# Patient Record
Sex: Female | Born: 1975 | Race: White | Hispanic: No | Marital: Married | Smoking: Never smoker
Health system: Southern US, Community
[De-identification: ages and names within clinical notes are randomized; demographics above are authoritative.]

## PROBLEM LIST (undated history)

## (undated) DIAGNOSIS — T7840XA Allergy, unspecified, initial encounter: Secondary | ICD-10-CM

## (undated) DIAGNOSIS — F329 Major depressive disorder, single episode, unspecified: Secondary | ICD-10-CM

## (undated) DIAGNOSIS — F32A Depression, unspecified: Secondary | ICD-10-CM

## (undated) DIAGNOSIS — F419 Anxiety disorder, unspecified: Secondary | ICD-10-CM

## (undated) DIAGNOSIS — E785 Hyperlipidemia, unspecified: Secondary | ICD-10-CM

## (undated) HISTORY — PX: BREAST SURGERY: SHX581

## (undated) HISTORY — DX: Depression, unspecified: F32.A

## (undated) HISTORY — DX: Anxiety disorder, unspecified: F41.9

## (undated) HISTORY — DX: Major depressive disorder, single episode, unspecified: F32.9

## (undated) HISTORY — PX: CHOLECYSTECTOMY: SHX55

## (undated) HISTORY — DX: Hyperlipidemia, unspecified: E78.5

## (undated) HISTORY — DX: Allergy, unspecified, initial encounter: T78.40XA

---

## 2001-04-28 ENCOUNTER — Encounter: Payer: Self-pay | Admitting: Obstetrics and Gynecology

## 2001-04-28 ENCOUNTER — Ambulatory Visit (HOSPITAL_COMMUNITY): Admission: RE | Admit: 2001-04-28 | Discharge: 2001-04-28 | Payer: Self-pay | Admitting: *Deleted

## 2001-12-08 ENCOUNTER — Other Ambulatory Visit: Admission: RE | Admit: 2001-12-08 | Discharge: 2001-12-08 | Payer: Self-pay | Admitting: Obstetrics and Gynecology

## 2002-07-19 ENCOUNTER — Inpatient Hospital Stay (HOSPITAL_COMMUNITY): Admission: RE | Admit: 2002-07-19 | Discharge: 2002-07-21 | Payer: Self-pay | Admitting: Obstetrics and Gynecology

## 2011-10-30 ENCOUNTER — Ambulatory Visit (INDEPENDENT_AMBULATORY_CARE_PROVIDER_SITE_OTHER): Payer: BC Managed Care – PPO

## 2011-10-30 DIAGNOSIS — Z23 Encounter for immunization: Secondary | ICD-10-CM

## 2011-10-30 DIAGNOSIS — L253 Unspecified contact dermatitis due to other chemical products: Secondary | ICD-10-CM

## 2011-10-30 DIAGNOSIS — F341 Dysthymic disorder: Secondary | ICD-10-CM

## 2012-01-15 ENCOUNTER — Ambulatory Visit: Payer: BC Managed Care – PPO | Admitting: Internal Medicine

## 2012-01-30 ENCOUNTER — Other Ambulatory Visit: Payer: Self-pay | Admitting: Internal Medicine

## 2012-01-31 ENCOUNTER — Telehealth: Payer: Self-pay

## 2012-01-31 NOTE — Telephone Encounter (Signed)
Pt would like a refill on celexa 40 mg.

## 2012-02-17 ENCOUNTER — Other Ambulatory Visit: Payer: Self-pay | Admitting: Internal Medicine

## 2012-05-21 ENCOUNTER — Other Ambulatory Visit: Payer: Self-pay | Admitting: Physician Assistant

## 2012-05-22 ENCOUNTER — Other Ambulatory Visit: Payer: Self-pay | Admitting: Internal Medicine

## 2012-06-26 ENCOUNTER — Other Ambulatory Visit: Payer: Self-pay | Admitting: Internal Medicine

## 2012-06-26 ENCOUNTER — Other Ambulatory Visit: Payer: Self-pay | Admitting: Physician Assistant

## 2012-06-26 NOTE — Telephone Encounter (Signed)
Chart pulled DOS 10/30/11

## 2012-06-27 NOTE — Telephone Encounter (Signed)
Please call this patient.  She'd overdue for follow-up and has been notified that she needs an OV for refills x 2.  What's the plan?

## 2012-06-29 NOTE — Telephone Encounter (Signed)
Spoke with patient, and she states she has been unable to come in due to her crazy work schedule.  She thinks she can come in in around 2 weeks, and was transferred to appt center to schedule.  Can we rx x 1 month/

## 2012-07-24 ENCOUNTER — Telehealth: Payer: Self-pay

## 2012-07-24 MED ORDER — CITALOPRAM HYDROBROMIDE 40 MG PO TABS: 40.0000 mg | ORAL_TABLET | Freq: Every day | ORAL | Status: DC

## 2012-07-24 NOTE — Telephone Encounter (Signed)
Please pull paper chart.  

## 2012-07-24 NOTE — Telephone Encounter (Signed)
The patient called to request that her Celexa be refilled.  The patient's pharmacy stated she needed to contact [facility].  Please call the patient back at [phone].

## 2012-07-24 NOTE — Telephone Encounter (Signed)
Please advise patient that she needs an office visit for further refills.

## 2012-07-24 NOTE — Telephone Encounter (Signed)
Chart pulled to PA pool at nurse station DOS 10/30/11

## 2012-07-27 NOTE — Telephone Encounter (Signed)
Notified pt that we did send in a RF but that she is due for OV for add'l RFs. Pt agreed and will come in.

## 2012-08-15 ENCOUNTER — Encounter: Payer: Self-pay | Admitting: Physician Assistant

## 2012-08-15 ENCOUNTER — Ambulatory Visit (INDEPENDENT_AMBULATORY_CARE_PROVIDER_SITE_OTHER): Payer: BC Managed Care – PPO | Admitting: Physician Assistant

## 2012-08-15 VITALS — BP 130/80 | HR 84 | Temp 98.0°F | Resp 18 | Ht 63.0 in | Wt 154.0 lb

## 2012-08-15 DIAGNOSIS — F419 Anxiety disorder, unspecified: Secondary | ICD-10-CM

## 2012-08-15 DIAGNOSIS — F418 Other specified anxiety disorders: Secondary | ICD-10-CM

## 2012-08-15 DIAGNOSIS — F341 Dysthymic disorder: Secondary | ICD-10-CM

## 2012-08-15 DIAGNOSIS — F329 Major depressive disorder, single episode, unspecified: Secondary | ICD-10-CM

## 2012-08-15 DIAGNOSIS — F411 Generalized anxiety disorder: Secondary | ICD-10-CM

## 2012-08-15 MED ORDER — CITALOPRAM HYDROBROMIDE 40 MG PO TABS: 40.0000 mg | ORAL_TABLET | Freq: Every day | ORAL | Status: DC

## 2012-08-15 MED ORDER — CLONAZEPAM 1 MG PO TABS: 1.0000 mg | ORAL_TABLET | Freq: Two times a day (BID) | ORAL | Status: DC | PRN

## 2012-08-15 NOTE — Progress Notes (Signed)
   [address], [place] [address]   Phone [phone]  Subjective:    Patient ID: Diane May, female    DOB: 1976/09/21, 36 y.o.   MRN: [id number]  HPI Pt presents to clinic for recheck of her depression and anxiety.  She has been on celexa since 12/2010 and it has really been helping her.  She has been out for the last 3 days and she feels terrible with increased anxiety and nausea.  She feels like the Ativan is no longer working for her - she feels like she has increased anxiety about 2x/wk when she has increased agitation and irritability. She tried Klonopin right after her mother died and it did not work but she would like to try it again.  She does not want to be on xanax because she has a friend who is addicted and it worries about addictive medications.   Review of Systems  Psychiatric/Behavioral: Negative for disturbed wake/sleep cycle. The patient is nervous/anxious.        Objective:   Physical Exam  Vitals reviewed. Constitutional: She is oriented to person, place, and time. She appears well-developed and well-nourished.  HENT:  Head: Normocephalic and atraumatic.  Right Ear: External ear normal.  Left Ear: External ear normal.  Eyes: Conjunctivae normal are normal.  Pulmonary/Chest: Effort normal.  Neurological: She is alert and oriented to person, place, and time.  Skin: Skin is warm and dry.  Psychiatric: She has a normal mood and affect. Her behavior is normal. Judgment and thought content normal.       Assessment & Plan:   1. Anxiety  clonazePAM (KLONOPIN) 1 MG tablet  2. Depression  clonazePAM (KLONOPIN) 1 MG tablet  3. Depression with anxiety     Also sent in Celexa 40mg  qd #90 1 Rf.  D/w pt that is she continues to have breakthrough anxiety we may want to add Buspar or wellbutrin to her Celexa.  She is not interested in changing agents which I think makes sense because she has had such a good response to Celexa.  Pt will call with response to Klonopin  in after a couple of doses.

## 2012-09-15 ENCOUNTER — Other Ambulatory Visit: Payer: Self-pay | Admitting: Physician Assistant

## 2012-09-15 ENCOUNTER — Other Ambulatory Visit: Payer: Self-pay | Admitting: Radiology

## 2012-09-16 ENCOUNTER — Telehealth: Payer: Self-pay

## 2012-09-16 NOTE — Telephone Encounter (Signed)
Patient states she would like additional medication for anxiety. [facility] [place]

## 2012-09-16 NOTE — Telephone Encounter (Signed)
Also sent in Celexa 40mg  qd #90 1 Rf. D/w pt that is she continues to have breakthrough anxiety we may want to add Buspar or wellbutrin to her Celexa. She is not interested in changing agents which I think makes sense because she has had such a good response to Celexa. Pt will call with response to Klonopin in after a couple of doses.  From office visit with Maralyn Sago on 08/15/12  Left message for her to call me back to advise on what she is requesting, original phone message not clear.

## 2012-09-16 NOTE — Telephone Encounter (Signed)
PT STATES Diane May HAD SPOKEN WITH HER ABOUT ADDING ANOTHER MEDICINE TO THE CELEXA IF SHE WANTED TO TRY AND SHE DOES, BUT SHE DOESN'T WANT THE ONE WITH THE SMOKING. PLEASE CALL [phone]   [facility] [place]

## 2012-09-17 MED ORDER — BUSPIRONE HCL 7.5 MG PO TABS: 7.5000 mg | ORAL_TABLET | Freq: Two times a day (BID) | ORAL | Status: DC

## 2012-09-17 NOTE — Telephone Encounter (Signed)
Will send in Buspar. Please have patient call Maralyn Sago in a week to 10 days and give an update. Sooner if worse.

## 2012-09-17 NOTE — Telephone Encounter (Signed)
Called patient to advise  °

## 2012-09-26 ENCOUNTER — Other Ambulatory Visit: Payer: Self-pay | Admitting: Physician Assistant

## 2012-09-26 NOTE — Telephone Encounter (Signed)
PT IS REQUESTING A REFILL ON HER KLONOPIN [phone]

## 2012-09-28 NOTE — Telephone Encounter (Signed)
At TL desk.  I should see pt next month. To recheck after the addition of buspar.

## 2012-09-29 ENCOUNTER — Other Ambulatory Visit: Payer: Self-pay | Admitting: Radiology

## 2012-10-26 ENCOUNTER — Other Ambulatory Visit: Payer: Self-pay | Admitting: Physician Assistant

## 2012-10-26 NOTE — Telephone Encounter (Signed)
PATIENT CALLING IN REGARDS TO SEEING IF WE RECEIVED A FAX ON HER KLONOPIN RX REFILL FROM HER PHARMACY TODAY. SHE STATES "SHE DOES NOT WANT TO GO CRAZY WITHOUT HER MED'S FOR THE HOLIDAYS". PATIENT SAYS SHE WILL KEEP CALLING. SHE WAS NOTIFIED OF OUR POLICY AND PROCESS. THANK YOU!

## 2012-10-27 ENCOUNTER — Other Ambulatory Visit: Payer: Self-pay | Admitting: *Deleted

## 2012-10-27 NOTE — Telephone Encounter (Signed)
I have sent in the Rx to Tl desk.  I would like her to recheck next month.

## 2012-11-19 ENCOUNTER — Other Ambulatory Visit: Payer: Self-pay | Admitting: Physician Assistant

## 2012-11-19 NOTE — Telephone Encounter (Signed)
Please call the patient - this was supposed to be a prn medication not a daily medication - I filled last on 12/23 so it is early to ask for it - I think that she needs an OV to discuss.

## 2012-11-25 ENCOUNTER — Other Ambulatory Visit: Payer: Self-pay | Admitting: Physician Assistant

## 2012-11-26 ENCOUNTER — Telehealth: Payer: Self-pay

## 2012-11-26 NOTE — Telephone Encounter (Signed)
[facility], they have RX for Clonazepam.

## 2012-11-26 NOTE — Telephone Encounter (Signed)
Patient states the pharmacy has not received her request for clonazePAM (KLONOPIN) 1 MG tablet Please resend the script from the 22nd

## 2013-01-05 ENCOUNTER — Telehealth: Payer: Self-pay

## 2013-01-05 NOTE — Telephone Encounter (Signed)
PATIENT WOULD LIKE SARAH WEBER TO  KNOW SHE NEEDS TO GET REFILLS ON HER KLONOPIN FOR HER NERVES. THE PHARMACIST SAID SHE HAD TO CALL HERE FIRST.  BEST PHONE [phone] (CELL)   PHARMACY CHOICE IS [facility] IN , [place]     [facility]

## 2013-01-06 ENCOUNTER — Other Ambulatory Visit: Payer: Self-pay | Admitting: Physician Assistant

## 2013-01-06 NOTE — Telephone Encounter (Signed)
Pt needs OV 

## 2013-01-07 NOTE — Telephone Encounter (Signed)
Called to advise patient needs office visit.

## 2013-01-11 NOTE — Telephone Encounter (Signed)
I will give patient a few day supply but she will need an OV before any more refills.  At Tl desk for me to sign.

## 2013-02-11 ENCOUNTER — Telehealth: Payer: Self-pay

## 2013-02-11 MED ORDER — CLONAZEPAM 1 MG PO TABS: 0.5000 mg | ORAL_TABLET | Freq: Two times a day (BID) | ORAL | Status: DC | PRN

## 2013-02-11 NOTE — Telephone Encounter (Signed)
Pt has called back wanting to know if her prescription was ready.

## 2013-02-11 NOTE — Telephone Encounter (Signed)
At Tl desk 

## 2013-02-11 NOTE — Telephone Encounter (Signed)
Faxed left message for her to advise.

## 2013-02-11 NOTE — Telephone Encounter (Signed)
Diane May - PT'S NEW INSURANCE HAS NOT KICKED IN YET AND SHE CANT AFFORD TO COME IN AND PAY FULL PRICE RIGHT NOW.  WANTS TO KNOW IF YOU WILL REFILL THE KLONOPIN FOR JUST TWO WEEKS AND THEN SHE WILL BE IN FOR A VISIT. [phone]

## 2013-04-14 ENCOUNTER — Other Ambulatory Visit: Payer: Self-pay | Admitting: Physician Assistant

## 2013-06-16 ENCOUNTER — Other Ambulatory Visit: Payer: Self-pay | Admitting: Physician Assistant

## 2013-07-14 ENCOUNTER — Other Ambulatory Visit: Payer: Self-pay | Admitting: Physician Assistant

## 2013-07-16 ENCOUNTER — Telehealth: Payer: Self-pay

## 2013-07-16 NOTE — Telephone Encounter (Signed)
Pended please advise.  

## 2013-07-16 NOTE — Telephone Encounter (Signed)
Pt states that she is out of celexa ans she is due to have an OV but would like to know if a 7 day supply could be called in for her until she is able to come in . [phone]

## 2013-07-18 MED ORDER — CITALOPRAM HYDROBROMIDE 40 MG PO TABS: 40.0000 mg | ORAL_TABLET | Freq: Every day | ORAL | Status: DC

## 2013-07-18 NOTE — Telephone Encounter (Signed)
Left message rx sent to pharmacy

## 2013-07-18 NOTE — Telephone Encounter (Signed)
Sent!

## 2013-07-20 ENCOUNTER — Other Ambulatory Visit: Payer: Self-pay | Admitting: Physician Assistant

## 2013-07-21 ENCOUNTER — Ambulatory Visit: Payer: BC Managed Care – PPO | Admitting: Emergency Medicine

## 2013-07-21 VITALS — BP 106/76 | HR 80 | Temp 98.8°F | Resp 16 | Ht 63.5 in | Wt 162.2 lb

## 2013-07-21 DIAGNOSIS — F419 Anxiety disorder, unspecified: Secondary | ICD-10-CM

## 2013-07-21 DIAGNOSIS — F411 Generalized anxiety disorder: Secondary | ICD-10-CM

## 2013-07-21 DIAGNOSIS — F329 Major depressive disorder, single episode, unspecified: Secondary | ICD-10-CM

## 2013-07-21 MED ORDER — CITALOPRAM HYDROBROMIDE 40 MG PO TABS: 40.0000 mg | ORAL_TABLET | Freq: Every day | ORAL | Status: DC

## 2013-07-21 MED ORDER — CLONAZEPAM 1 MG PO TABS: 0.5000 mg | ORAL_TABLET | Freq: Two times a day (BID) | ORAL | Status: DC | PRN

## 2013-07-21 MED ORDER — BUSPIRONE HCL 7.5 MG PO TABS: 7.5000 mg | ORAL_TABLET | Freq: Two times a day (BID) | ORAL | Status: DC

## 2013-07-21 NOTE — Progress Notes (Signed)
[facility] [address], [place] [address] [phone]  Date:  07/21/2013   Name:  Diane May   DOB:  Apr 11, 1976   MRN:  [id number]  PCP:  No primary provider on file.    Chief Complaint: Medication Refill   History of Present Illness:  Diane May is a 37 y.o. very pleasant female patient who presents with the following:  Patient here for medication refills.  Denies other complaint or health concern today.  No suicidal thoughts or thoughts of harm to others.     Patient Active Problem List   Diagnosis Date Noted  . Depression with anxiety 08/15/2012    Past Medical History  Diagnosis Date  . Allergy   . Depression     Past Surgical History  Procedure Laterality Date  . Cholecystectomy    . Breast surgery      cyst removal from L breast  . Cesarean section      x3    History  Substance Use Topics  . Smoking status: Never Smoker   . Smokeless tobacco: Not on file  . Alcohol Use: No    Family History  Problem Relation Age of Onset  . Cancer Mother   . Cancer Father     Allergies  Allergen Reactions  . Codeine Nausea Only  . Penicillins Rash    Medication list has been reviewed and updated.  Current Outpatient Prescriptions on File Prior to Visit  Medication Sig Dispense Refill  . busPIRone (BUSPAR) 7.5 MG tablet Take 1 tablet (7.5 mg total) by mouth 2 (two) times daily.  30 tablet  0  . citalopram (CELEXA) 40 MG tablet Take 1 tablet (40 mg total) by mouth daily. Needs office visit (THIRD notice)  7 tablet  0  . clonazePAM (KLONOPIN) 1 MG tablet Take 0.5-1 tablets (0.5-1 mg total) by mouth 2 (two) times daily as needed for anxiety.  15 tablet  0   No current facility-administered medications on file prior to visit.    Review of Systems:  As per HPI, otherwise negative.    Physical Examination: Filed Vitals:   07/21/13 0924  BP: 106/76  Pulse: 80  Temp: 98.8 F (37.1 C)  Resp: 16   Filed Vitals:   07/21/13 0924  Height: 5' 3.5" (1.613 m)  Weight: 162 lb 3.2 oz (73.573 kg)   Body mass index is 28.28 kg/(m^2). Ideal Body Weight: Weight in (lb) to have BMI = 25: 143.1   GEN: WDWN, NAD, Non-toxic, Alert & Oriented x 3 HEENT: Atraumatic, Normocephalic.  Ears and Nose: No external deformity. EXTR: No clubbing/cyanosis/edema NEURO: Normal gait.  PSYCH: Normally interactive. Conversant. Not depressed or anxious appearing.  Calm demeanor.    Assessment and Plan: Refill meds Anxiety Depression Follow up in 6 months   Signed,  Phillips Odor, MD

## 2013-07-21 NOTE — Patient Instructions (Addendum)

## 2013-09-22 NOTE — Telephone Encounter (Signed)
Error

## 2014-01-20 ENCOUNTER — Ambulatory Visit: Payer: BC Managed Care – PPO | Admitting: Family Medicine

## 2014-01-20 VITALS — BP 110/80 | HR 76 | Temp 97.9°F | Resp 16 | Ht 63.0 in | Wt 159.0 lb

## 2014-01-20 DIAGNOSIS — F4323 Adjustment disorder with mixed anxiety and depressed mood: Secondary | ICD-10-CM

## 2014-01-20 MED ORDER — CITALOPRAM HYDROBROMIDE 40 MG PO TABS: 40.0000 mg | ORAL_TABLET | Freq: Every day | ORAL | Status: DC

## 2014-01-20 MED ORDER — CLONAZEPAM 1 MG PO TABS: 0.5000 mg | ORAL_TABLET | Freq: Two times a day (BID) | ORAL | Status: DC | PRN

## 2014-01-20 NOTE — Progress Notes (Signed)
[facility] [address] [place] [address] [phone]  Date:  01/20/2014   Name:  Diane May   DOB:  [birth year]   MRN:  [id number]  PCP:  No primary provider on file.    Chief Complaint: Medication Refill   History of Present Illness:  Diane May is a 38 y.o. very pleasant female patient who presents with the following:  She needs a refill of her medications today.  She uses celexa and klonopin as needed for anxiety.  She is on celexa 40, and is doing ok with this.  She does note that she is tearful sometimes and may lose her temper easily.  No SI or HI.  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    LMP was about one week ago.   Appetite is ok.    She is a non- smoker.    Patient Active Problem List   Diagnosis Date Noted  . Depression with anxiety 08/15/2012    Past Medical History  Diagnosis Date  . Allergy   . Depression     Past Surgical History  Procedure Laterality Date  . Cholecystectomy    . Breast surgery      cyst removal from L breast  . Cesarean section      x3    History  Substance Use Topics  . Smoking status: Never Smoker   . Smokeless tobacco: Not on file  . Alcohol Use: No    Family History  Problem Relation Age of Onset  . Cancer Mother   . Cancer Father     Allergies  Allergen Reactions  . Codeine Nausea Only  . Penicillins Rash    Medication list has been reviewed and updated.  Current Outpatient Prescriptions on File Prior to Visit  Medication Sig Dispense Refill  . citalopram (CELEXA) 40 MG tablet Take 1 tablet (40 mg total) by mouth daily.  40 tablet  5  . clonazePAM (KLONOPIN) 1 MG tablet Take 0.5-1 tablets (0.5-1 mg total) by mouth 2 (two) times daily as needed for anxiety.  60 tablet  5   No current facility-administered medications on file prior to visit.    Review of Systems:  GEN: WDWN, NAD, Non-toxic, A & O x 3 HEENT:  Atraumatic, Normocephalic. Neck supple. No masses, No LAD. Ears and Nose: No external deformity. CV: RRR, No M/G/R. No JVD. No thrill. No extra heart sounds. PULM: CTA B, no wheezes, crackles, rhonchi. No retractions. No resp. distress. No accessory muscle use. ABD: S, NT, ND, +BS. No rebound. No HSM. EXTR: No c/c/e NEURO Normal gait.  PSYCH: Normally interactive. Conversant. Not depressed or anxious appearing.  Calm demeanor.    Physical Examination: Filed Vitals:   01/20/14 1157  BP: 110/80  Pulse: 76  Temp: 97.9 F (36.6 C)  Resp: 16   Filed Vitals:   01/20/14 1157  Height: 5\' 3"  (1.6 m)  Weight: 159 lb (72.122 kg)   Body mass index is 28.17 kg/(m^2). Ideal Body Weight: Weight in (lb) to have BMI = 25: 140.8  GEN: WDWN, NAD, Non-toxic, A & O x 3, looks well HEENT: Atraumatic, Normocephalic. Neck supple. No masses, No LAD. Ears and Nose: No external deformity. CV: RRR, No M/G/R. No JVD. No thrill. No extra heart sounds. PULM: CTA B, no wheezes, crackles, rhonchi. No retractions. No resp. distress. No accessory muscle use. EXTR: No c/c/e NEURO  Normal gait.  PSYCH: Normally interactive. Conversant. Not depressed or anxious appearing.  Calm demeanor.    Assessment and Plan: Adjustment disorder with mixed anxiety and depressed mood - Plan: citalopram (CELEXA) 40 MG tablet, clonazePAM (KLONOPIN) 1 MG tablet  Recheck of depression and anxiety.  Refilled her celexa and klonopin.  She is not sure if her current medication regimen is controlling her sx, but admits she is under a lot of stress right now.  As she is about to start a new business tomorrow elected to stick with her current regimen for another month.  She will keep a symptom diary and let me know how she is doing; at that time consider adding wellbutrin or changing to a different SSRI if warranted. She will let me know sooner if worse.    Signed Lamar Blinks, MD

## 2014-01-20 NOTE — Patient Instructions (Signed)
Please let me know how things are going in a month or so.  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

## 2014-08-09 ENCOUNTER — Other Ambulatory Visit: Payer: Self-pay | Admitting: Family Medicine

## 2014-08-09 DIAGNOSIS — F4323 Adjustment disorder with mixed anxiety and depressed mood: Secondary | ICD-10-CM

## 2014-08-11 MED ORDER — CITALOPRAM HYDROBROMIDE 40 MG PO TABS: 40.0000 mg | ORAL_TABLET | Freq: Every day | ORAL | Status: DC

## 2014-08-11 NOTE — Telephone Encounter (Signed)
Called her as I have not heard from her.  She does not have health insurance for the time being, but plans to come in next month or so. Her husband was laid off so she lost her insurance. She is doing very well overall and would like to continue her medications if possible.  I will refill for another 90 days supply, but she knows she needs to see me by the end of the year and plans to do so.

## 2014-10-19 ENCOUNTER — Telehealth: Payer: Self-pay

## 2014-10-19 NOTE — Telephone Encounter (Signed)
°  Pt states she is having some issues she want to discuss with a nurse or someone. Please call [phone]

## 2014-10-19 NOTE — Telephone Encounter (Signed)
Spoke to pt. She has been having heavy vaginal bleeding with blood clots for the past 2-3 months. She was wondering if I knew what could be causing it. I told her she should contact her gynecologist regarding this issue. She does not have a regular gynecologist, but was going to call [facility] to see what they say; she would like to go somewhere that has [place].  If need be I told her she could be seen here and referred out if need be.

## 2014-12-11 ENCOUNTER — Emergency Department (HOSPITAL_COMMUNITY)
Admission: EM | Admit: 2014-12-11 | Discharge: 2014-12-11 | Disposition: A | Discharge status: Home / Self Care | Payer: 59 | Attending: Emergency Medicine | Admitting: Emergency Medicine

## 2014-12-11 DIAGNOSIS — Z79899 Other long term (current) drug therapy: Secondary | ICD-10-CM | POA: Diagnosis not present

## 2014-12-11 DIAGNOSIS — F329 Major depressive disorder, single episode, unspecified: Secondary | ICD-10-CM | POA: Insufficient documentation

## 2014-12-11 DIAGNOSIS — M62838 Other muscle spasm: Secondary | ICD-10-CM | POA: Diagnosis not present

## 2014-12-11 DIAGNOSIS — M549 Dorsalgia, unspecified: Secondary | ICD-10-CM | POA: Diagnosis present

## 2014-12-11 MED ORDER — ONDANSETRON 4 MG PO TBDP: 4.0000 mg | ORAL_TABLET | Freq: Three times a day (TID) | ORAL | Status: DC | PRN

## 2014-12-11 MED ORDER — ONDANSETRON 4 MG PO TBDP
4.0000 mg | ORAL_TABLET | Freq: Once | ORAL | Status: AC
  Administered 2014-12-11: 4 mg via ORAL
  Filled 2014-12-11: qty 1

## 2014-12-11 MED ORDER — DIAZEPAM 5 MG PO TABS
5.0000 mg | ORAL_TABLET | Freq: Once | ORAL | Status: AC
  Administered 2014-12-11: 5 mg via ORAL
  Filled 2014-12-11: qty 1

## 2014-12-11 MED ORDER — IBUPROFEN 800 MG PO TABS
800.0000 mg | ORAL_TABLET | Freq: Once | ORAL | Status: AC
  Administered 2014-12-11: 800 mg via ORAL
  Filled 2014-12-11: qty 1

## 2014-12-11 MED ORDER — DIAZEPAM 5 MG PO TABS: 5.0000 mg | ORAL_TABLET | Freq: Three times a day (TID) | ORAL | Status: DC | PRN

## 2014-12-11 MED ORDER — OXYCODONE-ACETAMINOPHEN 5-325 MG PO TABS
2.0000 | ORAL_TABLET | Freq: Once | ORAL | Status: AC
  Administered 2014-12-11: 2 via ORAL
  Filled 2014-12-11: qty 2

## 2014-12-11 MED ORDER — OXYCODONE-ACETAMINOPHEN 5-325 MG PO TABS: 1.0000 | ORAL_TABLET | ORAL | Status: DC | PRN

## 2014-12-11 MED ORDER — IBUPROFEN 800 MG PO TABS: 800.0000 mg | ORAL_TABLET | Freq: Three times a day (TID) | ORAL | Status: DC | PRN

## 2014-12-11 NOTE — ED Provider Notes (Signed)
TIME SEEN: 4:20 AM  CHIEF COMPLAINT: Right-sided neck pain  HPI: Pt is a 39 y.o. right-hand-dominant female with history of depression who presents to the emergency department with right-sided trapezius muscle spasm and pain. She's having difficulty turning her neck because of this pain. Pain started yesterday. 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 Pain is worse with movement of her arm and better with massage. Unable to sleep tonight. No numbness, tingling or focal weakness. Normal gait. No bowel or bladder incontinence. No fever. No midline spinal tenderness.  ROS: See HPI Constitutional: no fever  Eyes: no drainage  ENT: no runny nose   Cardiovascular:  no chest pain  Resp: no SOB  GI: no vomiting GU: no dysuria Integumentary: no rash  Allergy: no hives  Musculoskeletal: no leg swelling  Neurological: no slurred speech ROS otherwise negative  PAST MEDICAL HISTORY/PAST SURGICAL HISTORY:  Past Medical History  Diagnosis Date  . Allergy   . Depression     MEDICATIONS:  Prior to Admission medications   Medication Sig Start Date End Date Taking? Authorizing Provider  citalopram (CELEXA) 40 MG tablet Take 1 tablet (40 mg total) by mouth daily. 08/11/14  Yes Gay Filler Copland, MD  clonazePAM (KLONOPIN) 1 MG tablet TAKE 1/2-1 TABLETS BY MOUTH TWICE DAILY AS NEEDED FOR ANXIETY 08/11/14  Yes Darreld Mclean, MD    ALLERGIES:  Allergies  Allergen Reactions  . Codeine Nausea Only  . Penicillins Rash    SOCIAL HISTORY:  History  Substance Use Topics  . Smoking status: Never Smoker   . Smokeless tobacco: Not on file  . Alcohol Use: No    FAMILY HISTORY: Family History  Problem Relation Age of Onset  . Cancer Mother   . Cancer Father     EXAM: BP 116/85 mmHg  Pulse 91  Temp(Src) 97.8 F (36.6 C) (Oral)  Resp 18  Ht 5\' 4"  (1.626 m)  Wt 165 lb (74.844 kg)  BMI 28.31 kg/m2  SpO2 99%  LMP 12/11/2014 (Exact  Date) CONSTITUTIONAL: Alert and oriented and responds appropriately to questions. Well-appearing; well-nourished HEAD: Normocephalic EYES: Conjunctivae clear, PERRL ENT: normal nose; no rhinorrhea; moist mucous membranes; pharynx without lesions noted NECK: Supple, no meningismus, no LAD; no midline spinal toes or step-off or deformity, patient has pain with turning her neck to the right and left, tender to palpation throughout the right trapezius muscle with muscle spasm but no obvious deformity of the shoulder or neck and there are no lesions or ecchymosis or erythema in this area  CARD: RRR; S1 and S2 appreciated; no murmurs, no clicks, no rubs, no gallops RESP: Normal chest excursion without splinting or tachypnea; breath sounds clear and equal bilaterally; no wheezes, no rhonchi, no rales,  ABD/GI: Normal bowel sounds; non-distended; soft, non-tender, no rebound, no guarding BACK:  The back appears normal and is non-tender to palpation, there is no CVA tenderness EXT: Normal ROM in all joints; non-tender to palpation; no edema; normal capillary refill; no cyanosis    SKIN: Normal color for age and race; warm NEURO: Moves all extremities equally, sensation to light touch intact diffusely, normal gait PSYCH: The patient's mood and manner are appropriate. Grooming and personal hygiene are appropriate.  MEDICAL DECISION MAKING: Patient here with trapezius muscle spasm neck strain. Will discharge home on Percocet, Valium, ibuprofen and Zofran. She is neurologically intact. She has no midline spinal tenderness. I do not feel she needs any neck imaging  at this time. Discussed return precautions. She verbalized understanding and is comfortable with plan.      [place], DO 12/11/14 [phone]

## 2014-12-11 NOTE — ED Notes (Signed)
Pt alert & oriented x4, stable gait. Patient given discharge instructions, paperwork & prescription(s). Patient informed not to drive, operate any equipment & handel any important documents 4 hours after taking pain medication. Patient  instructed to stop at the registration desk to finish any additional paperwork. Patient  verbalized understanding. Pt left department w/ no further questions. 

## 2014-12-11 NOTE — Discharge Instructions (Signed)
Muscle Cramps and Spasms Muscle cramps and spasms occur when a muscle or muscles tighten and you have no control over this tightening (involuntary muscle contraction). They are a common problem and can develop in any muscle. The most common place is in the calf muscles of the leg. Both muscle cramps and muscle spasms are involuntary muscle contractions, but they also have differences:   Muscle cramps are sporadic and painful. They may last a few seconds to a quarter of an hour. Muscle cramps are often more forceful and last longer than muscle spasms.  Muscle spasms may or may not be painful. They may also last just a few seconds or much longer. CAUSES  It is uncommon for cramps or spasms to be due to a serious underlying problem. In many cases, the cause of cramps or spasms is unknown. Some common causes are:   Overexertion.   Overuse from repetitive motions (doing the same thing over and over).   Remaining in a certain position for a long period of time.   Improper preparation, form, or technique while performing a sport or activity.   Dehydration.   Injury.   Side effects of some medicines.   Abnormally low levels of the salts and ions in your blood (electrolytes), especially potassium and calcium. This could happen if you are taking water pills (diuretics) or you are pregnant.  Some underlying medical problems can make it more likely to develop cramps or spasms. These include, but are not limited to:   Diabetes.   Parkinson disease.   Hormone disorders, such as thyroid problems.   Alcohol abuse.   Diseases specific to muscles, joints, and bones.   Blood vessel disease where not enough blood is getting to the muscles.  HOME CARE INSTRUCTIONS   Stay well hydrated. Drink enough water and fluids to keep your urine clear or pale yellow.  It may be helpful to massage, stretch, and relax the affected muscle.  For tight or tense muscles, use a warm towel, heating  pad, or hot shower water directed to the affected area.  If you are sore or have pain after a cramp or spasm, applying ice to the affected area may relieve discomfort.  Put ice in a plastic bag.  Place a towel between your skin and the bag.  Leave the ice on for 15-20 minutes, 03-04 times a day.  Medicines used to treat a known cause of cramps or spasms may help reduce their frequency or severity. Only take over-the-counter or prescription medicines as directed by your caregiver. SEEK MEDICAL CARE IF:  Your cramps or spasms get more severe, more frequent, or do not improve over time.  MAKE SURE YOU:   Understand these instructions.  Will watch your condition.  Will get help right away if you are not doing well or get worse. Document Released: 04/12/2002 Document Revised: 02/15/2013 Document Reviewed: 10/07/2012 [facility] Patient Information 2015 [place], [place]. This information is not intended to replace advice given to you by your health care provider. Make sure you discuss any questions you have with your health care provider.  Heat Therapy Heat therapy can help ease sore, stiff, injured, and tight muscles and joints. Heat relaxes your muscles, which may help ease your pain.  RISKS AND COMPLICATIONS If you have any of the following conditions, do not use heat therapy unless your health care provider has approved:  Poor circulation.  Healing wounds or scarred skin in the area being treated.  Diabetes, heart disease, or high  blood pressure.  Not being able to feel (numbness) the area being treated.  Unusual swelling of the area being treated.  Active infections.  Blood clots.  Cancer.  Inability to communicate pain. This may include young children and people who have problems with their brain function (dementia).  Pregnancy. Heat therapy should only be used on old, pre-existing, or long-lasting (chronic) injuries. Do not use heat therapy on new injuries unless directed  by your health care provider. HOW TO USE HEAT THERAPY There are several different kinds of heat therapy, including:  Moist heat pack.  Warm water bath.  Hot water bottle.  Electric heating pad.  Heated gel pack.  Heated wrap.  Electric heating pad. Use the heat therapy method suggested by your health care provider. Follow your health care provider's instructions on when and how to use heat therapy. GENERAL HEAT THERAPY RECOMMENDATIONS  Do not sleep while using heat therapy. Only use heat therapy while you are awake.  Your skin may turn pink while using heat therapy. Do not use heat therapy if your skin turns red.  Do not use heat therapy if you have new pain.  High heat or long exposure to heat can cause burns. Be careful when using heat therapy to avoid burning your skin.  Do not use heat therapy on areas of your skin that are already irritated, such as with a rash or sunburn. SEEK MEDICAL CARE IF:  You have blisters, redness, swelling, or numbness.  You have new pain.  Your pain is worse. MAKE SURE YOU:  Understand these instructions.  Will watch your condition.  Will get help right away if you are not doing well or get worse. Document Released: 01/13/2012 Document Revised: 03/07/2014 Document Reviewed: 12/14/2013 [facility] Patient Information 2015 [place], [place]. This information is not intended to replace advice given to you by your health care provider. Make sure you discuss any questions you have with your health care provider.   Cervical Sprain/Strain/Spasm A cervical sprain is an injury in the neck in which the strong, fibrous tissues (ligaments) that connect your neck bones stretch or tear. Cervical sprains can range from mild to severe. Severe cervical sprains can cause the neck vertebrae to be unstable. This can lead to damage of the spinal cord and can result in serious nervous system problems. The amount of time it takes for a cervical sprain to get better  depends on the cause and extent of the injury. Most cervical sprains heal in 1 to 3 weeks. CAUSES  Severe cervical sprains may be caused by:   Contact sport injuries (such as from football, rugby, wrestling, hockey, auto racing, gymnastics, diving, martial arts, or boxing).   Motor vehicle collisions.   Whiplash injuries. This is an injury from a sudden forward and backward whipping movement of the head and neck.  Falls.  Mild cervical sprains may be caused by:   Being in an awkward position, such as while cradling a telephone between your ear and shoulder.   Sitting in a chair that does not offer proper support.   Working at a poorly Landscape [employer] station.   Looking up or down for long periods of time.  SYMPTOMS   Pain, soreness, stiffness, or a burning sensation in the front, back, or sides of the neck. This discomfort may develop immediately after the injury or slowly, 24 hours or more after the injury.   Pain or tenderness directly in the middle of the back of the neck.   Shoulder  or upper back pain.   Limited ability to move the neck.   Headache.   Dizziness.   Weakness, numbness, or tingling in the hands or arms.   Muscle spasms.   Difficulty swallowing or chewing.   Tenderness and swelling of the neck.  DIAGNOSIS  Most of the time your health care provider can diagnose a cervical sprain by taking your history and doing a physical exam. Your health care provider will ask about previous neck injuries and any known neck problems, such as arthritis in the neck. X-rays may be taken to find out if there are any other problems, such as with the bones of the neck. Other tests, such as a CT scan or MRI, may also be needed.  TREATMENT  Treatment depends on the severity of the cervical sprain. Mild sprains can be treated with rest, keeping the neck in place (immobilization), and pain medicines. Severe cervical sprains are immediately immobilized. Further  treatment is done to help with pain, muscle spasms, and other symptoms and may include:  Medicines, such as pain relievers, numbing medicines, or muscle relaxants.   Physical therapy. This may involve stretching exercises, strengthening exercises, and posture training. Exercises and improved posture can help stabilize the neck, strengthen muscles, and help stop symptoms from returning.  HOME CARE INSTRUCTIONS   Put ice on the injured area.   Put ice in a plastic bag.   Place a towel between your skin and the bag.   Leave the ice on for 15-20 minutes, 3-4 times a day.   If your injury was severe, you may have been given a cervical collar to wear. A cervical collar is a two-piece collar designed to keep your neck from moving while it heals.  Do not remove the collar unless instructed by your health care provider.  If you have long hair, keep it outside of the collar.  Ask your health care provider before making any adjustments to your collar. Minor adjustments may be required over time to improve comfort and reduce pressure on your chin or on the back of your head.  Ifyou are allowed to remove the collar for cleaning or bathing, follow your health care provider's instructions on how to do so safely.  Keep your collar clean by wiping it with mild soap and water and drying it completely. If the collar you have been given includes removable pads, remove them every 1-2 days and hand wash them with soap and water. Allow them to air dry. They should be completely dry before you wear them in the collar.  If you are allowed to remove the collar for cleaning and bathing, wash and dry the skin of your neck. Check your skin for irritation or sores. If you see any, tell your health care provider.  Do not drive while wearing the collar.   Only take over-the-counter or prescription medicines for pain, discomfort, or fever as directed by your health care provider.   Keep all follow-up  appointments as directed by your health care provider.   Keep all physical therapy appointments as directed by your health care provider.   Make any needed adjustments to your workstation to promote good posture.   Avoid positions and activities that make your symptoms worse.   Warm up and stretch before being active to help prevent problems.  SEEK MEDICAL CARE IF:   Your pain is not controlled with medicine.   You are unable to decrease your pain medicine over time as planned.  Your activity level is not improving as expected.  SEEK IMMEDIATE MEDICAL CARE IF:   You develop any bleeding.  You develop stomach upset.  You have signs of an allergic reaction to your medicine.   Your symptoms get worse.   You develop new, unexplained symptoms.   You have numbness, tingling, weakness, or paralysis in any part of your body.  MAKE SURE YOU:   Understand these instructions.  Will watch your condition.  Will get help right away if you are not doing well or get worse. Document Released: 08/18/2007 Document Revised: 10/26/2013 Document Reviewed: 04/28/2013 [facility] Patient Information 2015 [place], [place]. This information is not intended to replace advice given to you by your health care provider. Make sure you discuss any questions you have with your health care provider.

## 2014-12-25 ENCOUNTER — Emergency Department (HOSPITAL_COMMUNITY)
Admission: EM | Admit: 2014-12-25 | Discharge: 2014-12-25 | Disposition: A | Discharge status: Home / Self Care | Payer: 59 | Attending: Emergency Medicine | Admitting: Emergency Medicine

## 2014-12-25 ENCOUNTER — Encounter (HOSPITAL_COMMUNITY): Payer: Self-pay | Admitting: Emergency Medicine

## 2014-12-25 ENCOUNTER — Ambulatory Visit: Payer: 59

## 2014-12-25 DIAGNOSIS — M542 Cervicalgia: Secondary | ICD-10-CM | POA: Diagnosis not present

## 2014-12-25 DIAGNOSIS — R200 Anesthesia of skin: Secondary | ICD-10-CM | POA: Diagnosis not present

## 2014-12-25 DIAGNOSIS — Z880 Allergy status to penicillin: Secondary | ICD-10-CM | POA: Diagnosis not present

## 2014-12-25 DIAGNOSIS — M25511 Pain in right shoulder: Secondary | ICD-10-CM | POA: Insufficient documentation

## 2014-12-25 DIAGNOSIS — Z79899 Other long term (current) drug therapy: Secondary | ICD-10-CM | POA: Insufficient documentation

## 2014-12-25 DIAGNOSIS — F329 Major depressive disorder, single episode, unspecified: Secondary | ICD-10-CM | POA: Diagnosis not present

## 2014-12-25 MED ORDER — BACLOFEN 20 MG PO TABS: 20.0000 mg | ORAL_TABLET | Freq: Three times a day (TID) | ORAL | Status: DC

## 2014-12-25 MED ORDER — TRAMADOL HCL 50 MG PO TABS: 50.0000 mg | ORAL_TABLET | Freq: Four times a day (QID) | ORAL | Status: DC | PRN

## 2014-12-25 MED ORDER — PREDNISONE 20 MG PO TABS: ORAL_TABLET | ORAL | Status: DC

## 2014-12-25 NOTE — ED Notes (Signed)
Dr Waverly Ferrari in prior to RN, see edp assessment for further,

## 2014-12-25 NOTE — ED Provider Notes (Signed)
CSN: [id number]     Arrival date & time 12/25/14  1509 History   First MD Initiated Contact with Patient 12/25/14 1520     Chief Complaint  Patient presents with  . Shoulder Pain     (Consider location/radiation/quality/duration/timing/severity/associated sxs/prior Treatment) HPI Comments: Patient presents to the ER for evaluation of continued pain in the right arm and neck area. Patient reports injury a week ago. She was seen in the ER prescribed medications, but has not had any improvement. Patient reports pain starting in the right side of her neck and going down across the shoulder. She has now started to have increased pain in the area of the right elbow as well. Some numbness and tingling in the thumb and first finger, no weakness in the extremity.  Patient is a 39 y.o. female presenting with shoulder pain.  Shoulder Pain Associated symptoms: neck pain     Past Medical History  Diagnosis Date  . Allergy   . Depression    Past Surgical History  Procedure Laterality Date  . Cholecystectomy    . Breast surgery      cyst removal from L breast  . Cesarean section      x3   Family History  Problem Relation Age of Onset  . Cancer Mother   . Cancer Father    History  Substance Use Topics  . Smoking status: Never Smoker   . Smokeless tobacco: Not on file  . Alcohol Use: No   OB History    No data available     Review of Systems  Musculoskeletal: Positive for arthralgias and neck pain.  Neurological: Positive for numbness. Negative for weakness.  All other systems reviewed and are negative.     Allergies  Codeine and Penicillins  Home Medications   Prior to Admission medications   Medication Sig Start Date End Date Taking? Authorizing Provider  citalopram (CELEXA) 40 MG tablet Take 1 tablet (40 mg total) by mouth daily. 08/11/14   Gay Filler Copland, MD  clonazePAM (KLONOPIN) 1 MG tablet TAKE 1/2-1 TABLETS BY MOUTH TWICE DAILY AS NEEDED FOR ANXIETY 08/11/14    Gay Filler Copland, MD  diazepam (VALIUM) 5 MG tablet Take 1 tablet (5 mg total) by mouth every 8 (eight) hours as needed for muscle spasms. 12/11/14   Kristen N Ward, DO  ibuprofen (ADVIL,MOTRIN) 800 MG tablet Take 1 tablet (800 mg total) by mouth every 8 (eight) hours as needed for mild pain. 12/11/14   Kristen N Ward, DO  ondansetron (ZOFRAN ODT) 4 MG disintegrating tablet Take 1 tablet (4 mg total) by mouth every 8 (eight) hours as needed for nausea or vomiting. 12/11/14   Kristen N Ward, DO  oxyCODONE-acetaminophen (PERCOCET/ROXICET) 5-325 MG per tablet Take 1 tablet by mouth every 4 (four) hours as needed. 12/11/14   Kristen N Ward, DO   BP 129/72 mmHg  Pulse 78  Temp(Src) 98.2 F (36.8 C) (Oral)  Resp 15  Ht 5\' 4"  (1.626 m)  Wt 167 lb (75.751 kg)  BMI 28.65 kg/m2  SpO2 100%  LMP 12/11/2014 (Exact Date) Physical Exam  Constitutional: She is oriented to person, place, and time. She appears well-developed and well-nourished. No distress.  HENT:  Head: Normocephalic and atraumatic.  Right Ear: Hearing normal.  Left Ear: Hearing normal.  Nose: Nose normal.  Mouth/Throat: Oropharynx is clear and moist and mucous membranes are normal.  Eyes: Conjunctivae and EOM are normal. Pupils are equal, round, and reactive to light.  Neck:  Normal range of motion. Neck supple. Muscular tenderness present.    Cardiovascular: Regular rhythm, S1 normal and S2 normal.  Exam reveals no gallop and no friction rub.   No murmur heard. Pulses:      Radial pulses are 2+ on the right side.  Pulmonary/Chest: Effort normal and breath sounds normal. No respiratory distress. She exhibits no tenderness.  Abdominal: Soft. Normal appearance and bowel sounds are normal. There is no hepatosplenomegaly. There is no tenderness. There is no rebound, no guarding, no tenderness at McBurney's point and negative Murphy's sign. No hernia.  Musculoskeletal:       Right shoulder: She exhibits decreased range of motion and  tenderness. She exhibits no deformity.       Right elbow: Tenderness found. Lateral epicondyle tenderness noted.       Cervical back: She exhibits tenderness.       Back:  Neurological: She is alert and oriented to person, place, and time. She has normal strength. No cranial nerve deficit or sensory deficit. Coordination normal. GCS eye subscore is 4. GCS verbal subscore is 5. GCS motor subscore is 6.  Skin: Skin is warm, dry and intact. No rash noted. No cyanosis.  Psychiatric: She has a normal mood and affect. Her speech is normal and behavior is normal. Thought content normal.  Nursing note and vitals reviewed.   ED Course  Procedures (including critical care time) Labs Review Labs Reviewed - No data to display  Imaging Review No results found.   EKG Interpretation None      MDM   Final diagnoses:  None   right shoulder pain  Patient presents to the ER for evaluation of continued pain in the right shoulder. Patient has pain starting at the right neck and radiating down the arm. She does not have any neurologic deficits on examination. Patient has diffuse tenderness over the trapezius area, right paraspinal neck area. Additionally she has tenderness over the lateral epicondyle of the elbow which worsens with range of motion against resistance. This might be consistent with tendinitis of the elbow, but the pain more proximal areas of questionable origin. Cannot rule out shoulder, such as rotator cuff, but also cervical radiculopathy is possible. Patient will continue treatment with analgesia, was also prescribed a muscle relaxer and prednisone. She is going to follow-up with Dr. Aline Brochure this week.    Orpah Greek, MD 12/25/14 1540

## 2014-12-25 NOTE — Discharge Instructions (Signed)
Shoulder Pain The shoulder is the joint that connects your arms to your body. The bones that form the shoulder joint include the upper arm bone (humerus), the shoulder blade (scapula), and the collarbone (clavicle). The top of the humerus is shaped like a ball and fits into a rather flat socket on the scapula (glenoid cavity). A combination of muscles and strong, fibrous tissues that connect muscles to bones (tendons) support your shoulder joint and hold the ball in the socket. Small, fluid-filled sacs (bursae) are located in different areas of the joint. They act as cushions between the bones and the overlying soft tissues and help reduce friction between the gliding tendons and the bone as you move your arm. Your shoulder joint allows a wide range of motion in your arm. This range of motion allows you to do things like scratch your back or throw a ball. However, this range of motion also makes your shoulder more prone to pain from overuse and injury. Causes of shoulder pain can originate from both injury and overuse and usually can be grouped in the following four categories:  Redness, swelling, and pain (inflammation) of the tendon (tendinitis) or the bursae (bursitis).  Instability, such as a dislocation of the joint.  Inflammation of the joint (arthritis).  Broken bone (fracture). HOME CARE INSTRUCTIONS   Apply ice to the sore area.  Put ice in a plastic bag.  Place a towel between your skin and the bag.  Leave the ice on for 15-20 minutes, 3-4 times per day for the first 2 days, or as directed by your health care provider.  Stop using cold packs if they do not help with the pain.  If you have a shoulder sling or immobilizer, wear it as long as your caregiver instructs. Only remove it to shower or bathe. Move your arm as little as possible, but keep your hand moving to prevent swelling.  Squeeze a soft ball or foam pad as much as possible to help prevent swelling.  Only take  over-the-counter or prescription medicines for pain, discomfort, or fever as directed by your caregiver. SEEK MEDICAL CARE IF:   Your shoulder pain increases, or new pain develops in your arm, hand, or fingers.  Your hand or fingers become cold and numb.  Your pain is not relieved with medicines. SEEK IMMEDIATE MEDICAL CARE IF:   Your arm, hand, or fingers are numb or tingling.  Your arm, hand, or fingers are significantly swollen or turn white or blue. MAKE SURE YOU:   Understand these instructions.  Will watch your condition.  Will get help right away if you are not doing well or get worse. Document Released: 07/31/2005 Document Revised: 03/07/2014 Document Reviewed: 10/05/2011 [facility] Patient Information 2015 [place], [place]. This information is not intended to replace advice given to you by your health care provider. Make sure you discuss any questions you have with your health care provider. Cervical Radiculopathy Cervical radiculopathy happens when a nerve in the neck is pinched or bruised by a slipped (herniated) disk or by arthritic changes in the bones of the cervical spine. This can occur due to an injury or as part of the normal aging process. Pressure on the cervical nerves can cause pain or numbness that runs from your neck all the way down into your arm and fingers. CAUSES  There are many possible causes, including:  Injury.  Muscle tightness in the neck from overuse.  Swollen, painful joints (arthritis).  Breakdown or degeneration in the bones  and joints of the spine (spondylosis) due to aging.  Bone spurs that may develop near the cervical nerves. SYMPTOMS  Symptoms include pain, weakness, or numbness in the affected arm and hand. Pain can be severe or irritating. Symptoms may be worse when extending or turning the neck. DIAGNOSIS  Your caregiver will ask about your symptoms and do a physical exam. He or she may test your strength and reflexes. X-rays, CT  scans, and MRI scans may be needed in cases of injury or if the symptoms do not go away after a period of time. Electromyography (EMG) or nerve conduction testing may be done to study how your nerves and muscles are working. TREATMENT  Your caregiver may recommend certain exercises to help relieve your symptoms. Cervical radiculopathy can, and often does, get better with time and treatment. If your problems continue, treatment options may include:  Wearing a soft collar for short periods of time.  Physical therapy to strengthen the neck muscles.  Medicines, such as nonsteroidal anti-inflammatory drugs (NSAIDs), oral corticosteroids, or spinal injections.  Surgery. Different types of surgery may be done depending on the cause of your problems. HOME CARE INSTRUCTIONS   Put ice on the affected area.  Put ice in a plastic bag.  Place a towel between your skin and the bag.  Leave the ice on for 15-20 minutes, 03-04 times a day or as directed by your caregiver.  If ice does not help, you can try using heat. Take a warm shower or bath, or use a hot water bottle as directed by your caregiver.  You may try a gentle neck and shoulder massage.  Use a flat pillow when you sleep.  Only take over-the-counter or prescription medicines for pain, discomfort, or fever as directed by your caregiver.  If physical therapy was prescribed, follow your caregiver's directions.  If a soft collar was prescribed, use it as directed. SEEK IMMEDIATE MEDICAL CARE IF:   Your pain gets much worse and cannot be controlled with medicines.  You have weakness or numbness in your hand, arm, face, or leg.  You have a high fever or a stiff, rigid neck.  You lose bowel or bladder control (incontinence).  You have trouble with walking, balance, or speaking. MAKE SURE YOU:   Understand these instructions.  Will watch your condition.  Will get help right away if you are not doing well or get worse. Document  Released: 07/16/2001 Document Revised: 01/13/2012 Document Reviewed: 06/04/2011 [facility] Patient Information 2015 [place], [place]. This information is not intended to replace advice given to you by your health care provider. Make sure you discuss any questions you have with your health care provider.

## 2014-12-25 NOTE — ED Notes (Signed)
Injury to right shoulder last week. Was treated with percocet and zofran  Still having pain to right shoulder, rates 8.  Have not taken any medication today.

## 2014-12-26 ENCOUNTER — Ambulatory Visit (INDEPENDENT_AMBULATORY_CARE_PROVIDER_SITE_OTHER): Payer: 59

## 2014-12-26 ENCOUNTER — Ambulatory Visit (INDEPENDENT_AMBULATORY_CARE_PROVIDER_SITE_OTHER): Payer: 59 | Admitting: Internal Medicine

## 2014-12-26 VITALS — BP 114/74 | HR 60 | Temp 97.8°F | Resp 20 | Ht 64.0 in | Wt 164.0 lb

## 2014-12-26 DIAGNOSIS — R112 Nausea with vomiting, unspecified: Secondary | ICD-10-CM

## 2014-12-26 DIAGNOSIS — F4323 Adjustment disorder with mixed anxiety and depressed mood: Secondary | ICD-10-CM

## 2014-12-26 DIAGNOSIS — M25511 Pain in right shoulder: Secondary | ICD-10-CM

## 2014-12-26 MED ORDER — OXYCODONE-ACETAMINOPHEN 5-325 MG PO TABS: 1.0000 | ORAL_TABLET | ORAL | Status: DC | PRN

## 2014-12-26 MED ORDER — KETOROLAC TROMETHAMINE 60 MG/2ML IM SOLN
60.0000 mg | Freq: Once | INTRAMUSCULAR | Status: AC
  Administered 2014-12-26: 60 mg via INTRAMUSCULAR

## 2014-12-26 MED ORDER — ONDANSETRON 4 MG PO TBDP: 4.0000 mg | ORAL_TABLET | Freq: Three times a day (TID) | ORAL | Status: DC | PRN

## 2014-12-26 MED ORDER — CLONAZEPAM 1 MG PO TABS: ORAL_TABLET | ORAL | Status: DC

## 2014-12-26 NOTE — Patient Instructions (Signed)
Take your meds as directed. Stop the tramadol and take the percocet instead for pain. Use the zofran if needed for nausea when you are taking the percocet. Do not drive or use any dangerous machinery when you are taking the percocet. See the sports medicine dr in follow up as directed. If your symptoms worsen return to the office.

## 2014-12-26 NOTE — Progress Notes (Signed)
   Subjective:    Patient ID: Diane May, female    DOB: 24-Jul-1976, 39 y.o.   MRN: [id number]  HPI 39 year old female with cc  1. 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 She has had pain with tingling in the fingers of the right hand fingers 543 . Pain is 5/10 worse with motion of the right shoulder radiates to the hand and the elbow. She has seen 3 drs thus far and has been on norco,steroids, percocet, baclofan and is now on tramadol which is offering her no relief. She has an appointment with  A sports medicine dr in 3 days but is having pain in the should er which is preve1.nting her from work. 2. Depression anxiety She feels more anxious and tearful and is adking for a med change. She was on steroids for her shoulder and has run out of hte klonipin and has not been taking the celexa for fear of side effects while taking the tramadol  She is not suicidal or homicidal and hse is here with her husband and daughter and has a supportive family . She does not want to see a therapist at this time.   Review of Systems  Constitutional: Positive for activity change.  Eyes: Negative.   Respiratory: Negative.   Cardiovascular: Negative.   Gastrointestinal: Negative.   Endocrine: Negative.   Genitourinary: Negative.   Musculoskeletal:       Right shoulder pain Increased with range of motion  Allergic/Immunologic: Negative.   Neurological: Negative.   Hematological: Negative.   Psychiatric/Behavioral: Negative.   All other systems reviewed and are negative.      Objective:   Physical Exam  Constitutional: She is oriented to person, place, and time. She appears well-developed and well-nourished.  HENT:  Head: Normocephalic and atraumatic.  Nose: Nose normal.  Mouth/Throat: Oropharynx is clear and moist.  Eyes: Conjunctivae and EOM are normal. Pupils are equal, round, and reactive to light.  Neck:  Normal range of motion. Neck supple.  Cardiovascular: Normal rate, regular rhythm and normal heart sounds.   Pulmonary/Chest: Effort normal and breath sounds normal.  Abdominal: Soft. Bowel sounds are normal.  Musculoskeletal:  Pain on range of motion of the right shoulder.pain on palpation of the trapezious muscle . Neurovascular is intact.  Neurological: She is alert and oriented to person, place, and time. She has normal reflexes.  Skin: Skin is warm and dry.  Psychiatric: She has a normal mood and affect. Her behavior is normal. Judgment and thought content normal.  Nursing note and vitals reviewed.         Assessment & Plan:  1. Pain in the right shoulder and trapezious muscle   Will stop the tramadol and restart the percocet.  Will administer toradol in the office Agree with referral to ortho/sports medicine Pt may require mri of cervical spine if symptoms continue 2.Depression anxiety/ Medication renewal  Pt has not been taking her celexa or klonipin while tahking these pain meds. Has had more symptoms of anxiety and tearfullness but no suicidal or homocidal thoughts.  I have encouraged her to take her meds, she is going to stop the tamadol and can take the celexa and klonipin with the percocet. Will reeval her depression and anxiety after she completes meds fo rthe shoulder injury.I explained that the prednisone also may have exaccerbated her symptoms.

## 2014-12-27 ENCOUNTER — Telehealth: Payer: Self-pay

## 2014-12-27 DIAGNOSIS — M25511 Pain in right shoulder: Secondary | ICD-10-CM

## 2014-12-27 NOTE — Telephone Encounter (Signed)
Patient was seen yesterday at our office by Dr. Benjaman Lobe and was told she would be referred to [facility] phone # [phone] fax # [phone]. Patient requesting a referral to be put in. Patients call back number is [phone]

## 2014-12-27 NOTE — Progress Notes (Signed)
[facility] reading (PRIMARY) by  Dr. Brigitte Pulse and Benjaman Lobe Rt shoulder xray: no acute abnormality

## 2014-12-27 NOTE — Telephone Encounter (Signed)
Assessment & Plan:  1. Pain in the right shoulder and trapezious muscle  Will stop the tramadol and restart the percocet.  Will administer toradol in the office Agree with referral to ortho/sports medicine Pt may require mri of cervical spine if symptoms continue 2.Depression anxiety/ Medication renewal  Pt has not been taking her celexa or klonipin while tahking these pain meds. Has had more symptoms of anxiety and tearfullness but no suicidal or homocidal thoughts.  I have encouraged her to take her meds, she is going to stop the tamadol and can take the celexa and klonipin with the percocet. Will reeval her depression and anxiety after she completes meds fo rthe shoulder injury.I explained that the prednisone also may have exaccerbated her symptoms.           Dr. Charlotte Sanes was ok with referral to ortho. Referral placed. Called pt to let her know.

## 2014-12-29 ENCOUNTER — Ambulatory Visit (INDEPENDENT_AMBULATORY_CARE_PROVIDER_SITE_OTHER): Payer: 59 | Admitting: Orthopedic Surgery

## 2014-12-29 ENCOUNTER — Ambulatory Visit (INDEPENDENT_AMBULATORY_CARE_PROVIDER_SITE_OTHER): Payer: 59

## 2014-12-29 VITALS — BP 111/73 | Ht 64.0 in | Wt 164.0 lb

## 2014-12-29 DIAGNOSIS — M542 Cervicalgia: Secondary | ICD-10-CM

## 2014-12-29 DIAGNOSIS — M5020 Other cervical disc displacement, unspecified cervical region: Secondary | ICD-10-CM

## 2014-12-29 NOTE — Patient Instructions (Signed)
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  Wean yourself from opioid narcotic medication (Percocet)  Call patient after MRI results for further treatment recommendations.

## 2014-12-29 NOTE — Progress Notes (Signed)
Patient ID: Diane May, female   DOB: 1976/08/06, 39 y.o.   MRN: [id number]  Chief Complaint  Patient presents with  . Follow-up    er follow up neck/right shoulder pain, injured picking up dog 12/11/14    HPI Diane May is a 39 y.o. female.  Presents with H Maddock onset of pain in her cervical spine which started about 2 weeks ago. She's been to the emergency room on 2 occasions. 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   HPI   Past Medical History  Diagnosis Date  . Allergy   . Depression     Past Surgical History  Procedure Laterality Date  . Cholecystectomy    . Breast surgery      cyst removal from L breast  . Cesarean section      x3    Family History  Problem Relation Age of Onset  . Cancer Mother   . Cancer Father     Social History History  Substance Use Topics  . Smoking status: Never Smoker   . Smokeless tobacco: Not on file  . Alcohol Use: No    Allergies  Allergen Reactions  . Codeine Nausea Only  . Penicillins Rash    Current Outpatient Prescriptions  Medication Sig Dispense Refill  . baclofen (LIORESAL) 20 MG tablet Take 1 tablet (20 mg total) by mouth 3 (three) times daily. 30 each 0  . ondansetron (ZOFRAN ODT) 4 MG disintegrating tablet Take 1 tablet (4 mg total) by mouth every 8 (eight) hours as needed for nausea or vomiting. 20 tablet 0  . oxyCODONE-acetaminophen (ROXICET) 5-325 MG per tablet Take 1 tablet by mouth every 4 (four) hours as needed for severe pain. 20 tablet 0  . predniSONE (DELTASONE) 20 MG tablet 3 tabs po daily x 3 days, then 2 tabs x 3 days, then 1.5 tabs x 3 days, then 1 tab x 3 days, then 0.5 tabs  x 3 days 27 tablet 0  . citalopram (CELEXA) 40 MG tablet Take 1 tablet (40 mg total) by mouth daily. (Patient not taking: Reported on 12/29/2014) 90 tablet 0  . clonazePAM (KLONOPIN) 1 MG tablet TAKE 1/2-1 TABLETS BY MOUTH TWICE DAILY AS NEEDED FOR ANXIETY (Patient not taking: Reported on 12/29/2014) 180 tablet 0  . ibuprofen (ADVIL,MOTRIN) 800 MG tablet Take 1 tablet (800 mg total) by mouth every 8 (eight) hours as needed for mild pain. (Patient not taking: Reported on 12/29/2014) 30 tablet 0   No current facility-administered medications for this visit.    Review of Systems Review of Systems Constitutional symptoms none other musculoskeletal symptoms none neurologic symptoms as stated, gait and station remain normal. No urinary symptoms or GI symptoms other than nausea from medication Blood pressure 111/73, height 5\' 4"  (1.626 m), weight 164 lb (74.39 kg), last menstrual period 12/11/2014.  Physical Exam Physical Exam  Constitutional: She is oriented to person, place, and time. She appears well-developed and well-nourished.  Musculoskeletal:  Ambulatory status normal without myelopathic changes  Neurological: She is alert and oriented to person, place, and time.  Skin: Skin is warm and dry.  Psychiatric: She has a normal mood and affect. Her behavior  is normal.   cervical spine tenderness and decreased range of motion especially turning the head and extension flexion with increased radicular symptoms including positive Spurling sign.  She has weakness on the right with normal strength on the left  The wrist extensors are weak and the biceps flexion test is weak. Shoulder elbow wrist hand stable pulse normal sensation changes decreased sensation numbness tingling or loss of sensation index finger and thumb. 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  . Neck pain   . 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

## 2015-01-11 ENCOUNTER — Ambulatory Visit (HOSPITAL_COMMUNITY)
Admission: RE | Admit: 2015-01-11 | Discharge: 2015-01-11 | Disposition: A | Discharge status: Home / Self Care | Payer: 59 | Source: Ambulatory Visit | Attending: Orthopedic Surgery | Admitting: Orthopedic Surgery

## 2015-01-11 DIAGNOSIS — M5022 Other cervical disc displacement, mid-cervical region: Secondary | ICD-10-CM | POA: Insufficient documentation

## 2015-01-11 DIAGNOSIS — R200 Anesthesia of skin: Secondary | ICD-10-CM | POA: Insufficient documentation

## 2015-01-11 DIAGNOSIS — M542 Cervicalgia: Secondary | ICD-10-CM | POA: Diagnosis present

## 2015-01-11 DIAGNOSIS — M4802 Spinal stenosis, cervical region: Secondary | ICD-10-CM | POA: Insufficient documentation

## 2015-01-12 ENCOUNTER — Telehealth: Payer: Self-pay | Admitting: Orthopedic Surgery

## 2015-01-12 NOTE — Telephone Encounter (Signed)
Patient is calling requesting MRI results and understands it will be Friday

## 2015-01-16 ENCOUNTER — Telehealth: Payer: Self-pay | Admitting: *Deleted

## 2015-01-16 ENCOUNTER — Other Ambulatory Visit: Payer: Self-pay | Admitting: *Deleted

## 2015-01-16 DIAGNOSIS — M5020 Other cervical disc displacement, unspecified cervical region: Secondary | ICD-10-CM

## 2015-01-16 NOTE — Telephone Encounter (Signed)
REFERRAL FAXED TO [facility]

## 2015-02-02 NOTE — Telephone Encounter (Signed)
APPT 02/09/15 W/ POOLE

## 2015-02-21 ENCOUNTER — Telehealth: Payer: Self-pay

## 2015-02-21 DIAGNOSIS — F4323 Adjustment disorder with mixed anxiety and depressed mood: Secondary | ICD-10-CM

## 2015-02-21 MED ORDER — CITALOPRAM HYDROBROMIDE 40 MG PO TABS: 40.0000 mg | ORAL_TABLET | Freq: Every day | ORAL | Status: DC

## 2015-02-21 NOTE — Telephone Encounter (Signed)
Please advise on Clonazepam.

## 2015-02-21 NOTE — Telephone Encounter (Signed)
Pt needs a refill on these meds-citalopram (CELEXA) 40 MG tablet [[id number]] and clonazePAM (KLONOPIN) 1 MG tablet [[id number]] . Please advise at [phone]

## 2015-02-21 NOTE — Telephone Encounter (Signed)
Needs ov.  I have not seen the patient for quite some time.  I will give her 2 weeks of her SSRI until she can get into see someone.

## 2015-02-22 NOTE — Telephone Encounter (Signed)
She has a bill with [place] and she cannot come up here due to lack of money. She is experiencing withdrawals with symptoms of dizziness and would at least want the refill for Celexa. She states she can pay her bill when she recieves her taxes. She states it is supposed to be here within 6 weeks. Please advise.

## 2015-02-24 MED ORDER — CITALOPRAM HYDROBROMIDE 40 MG PO TABS: 40.0000 mg | ORAL_TABLET | Freq: Every day | ORAL | Status: DC

## 2015-02-24 NOTE — Telephone Encounter (Signed)
I have sent in a 14 d supply and a 30d supply to last her until she can come in because I do not want her going through withdraw.  But then she must have an OV.

## 2015-02-28 NOTE — Telephone Encounter (Signed)
Spoke with pt, advised Rx was sent in. Pt will RTC.

## 2015-04-12 ENCOUNTER — Telehealth: Payer: Self-pay

## 2015-04-12 DIAGNOSIS — F4323 Adjustment disorder with mixed anxiety and depressed mood: Secondary | ICD-10-CM

## 2015-04-12 NOTE — Telephone Encounter (Incomplete)
****  Previous message was copied in error***  Patient is calling because she would like a 1 month medication refill for celexa and klonopin. This is so she can have enough to last until she is done paying off her balance and can be seen again.

## 2015-04-12 NOTE — Telephone Encounter (Signed)
[employer] is calling on behalf of patient. They are sending Dr. Carlota Raspberry a fax with some information that needs to be checked off. Please keep a lookout for it!

## 2015-04-13 NOTE — Telephone Encounter (Signed)
Patient is calling inquiring about her medication refill requests  - she mentioned two citalopram (CELEXA) 40 MG tablet She hung up before I could confirm the second medication she wants.   [phone]

## 2015-04-14 MED ORDER — CLONAZEPAM 1 MG PO TABS: ORAL_TABLET | ORAL | Status: DC

## 2015-04-14 MED ORDER — CITALOPRAM HYDROBROMIDE 40 MG PO TABS: 40.0000 mg | ORAL_TABLET | Freq: Every day | ORAL | Status: DC

## 2015-04-14 NOTE — Telephone Encounter (Signed)
I gave the patient 30 pills on 4/22 so I am not sure but I would think that she is out or is not taking correctly.  I will give her another month of her celexa and some Klonopin but then she must have an OV.

## 2015-04-14 NOTE — Telephone Encounter (Signed)
Pt called in wanting a refill on citalopram (CELEXA) 40 MG tablet [[id number]] and clonazePAM (KLONOPIN) 1 MG tablet [[id number]]. Please advise at [phone] (H

## 2015-04-14 NOTE — Telephone Encounter (Signed)
Diane May, I see that pt needing OV was discussed at last Rf and you only wanted to give pt small amount to give time to get in. Do you want to deny, or give a few tabs of celexa w/instr's how to taper off if she won't come in... ?

## 2015-04-17 NOTE — Telephone Encounter (Signed)
Called in [place]. Pt notified.

## 2019-04-28 ENCOUNTER — Other Ambulatory Visit: Payer: Self-pay

## 2019-04-28 ENCOUNTER — Other Ambulatory Visit: Payer: Self-pay | Admitting: Internal Medicine

## 2019-04-28 DIAGNOSIS — Z20822 Contact with and (suspected) exposure to covid-19: Secondary | ICD-10-CM

## 2019-05-02 LAB — NOVEL CORONAVIRUS, NAA: SARS-CoV-2, NAA: NOT DETECTED

## 2019-05-06 ENCOUNTER — Telehealth: Payer: Self-pay | Admitting: *Deleted

## 2019-05-06 NOTE — Telephone Encounter (Signed)
Pt aware results are negative.

## 2019-06-24 ENCOUNTER — Other Ambulatory Visit: Payer: Self-pay

## 2019-06-24 ENCOUNTER — Ambulatory Visit (INDEPENDENT_AMBULATORY_CARE_PROVIDER_SITE_OTHER): Payer: Managed Care, Other (non HMO) | Admitting: Obstetrics and Gynecology

## 2019-06-24 ENCOUNTER — Encounter: Payer: Self-pay | Admitting: Obstetrics and Gynecology

## 2019-06-24 VITALS — BP 118/72 | HR 84 | Temp 97.4°F | Resp 14 | Ht 62.5 in | Wt 160.2 lb

## 2019-06-24 DIAGNOSIS — N921 Excessive and frequent menstruation with irregular cycle: Secondary | ICD-10-CM | POA: Diagnosis not present

## 2019-06-24 LAB — POCT URINE PREGNANCY: Preg Test, Ur: NEGATIVE

## 2019-06-24 MED ORDER — NORETHINDRONE ACETATE 5 MG PO TABS: 5.0000 mg | ORAL_TABLET | Freq: Two times a day (BID) | ORAL | 0 refills | Status: DC

## 2019-06-24 NOTE — Patient Instructions (Signed)
Uterine Fibroids  Uterine fibroids (leiomyomas) are noncancerous (benign) tumors that can develop in the uterus. Fibroids may also develop in the fallopian tubes, cervix, or tissues (ligaments) near the uterus. You may have one or many fibroids. Fibroids vary in size, weight, and where they grow in the uterus. Some can become quite large. Most fibroids do not require medical treatment. What are the causes? The cause of this condition is not known. What increases the risk? You are more likely to develop this condition if you:  Are in your 30s or 40s and have not gone through menopause.  Have a family history of this condition.  Are of African-American descent.  Had your first period at an early age (early menarche).  Have not had any children (nulliparity).  Are overweight or obese. What are the signs or symptoms? Many women do not have any symptoms. Symptoms of this condition may include:  Heavy menstrual bleeding.  Bleeding or spotting between periods.  Pain and pressure in the pelvic area, between the hips.  Bladder problems, such as needing to urinate urgently or more often than usual.  Inability to have children (infertility).  Failure to carry pregnancy to term (miscarriage). How is this diagnosed? This condition may be diagnosed based on:  Your symptoms and medical history.  A physical exam.  A pelvic exam that includes feeling for any tumors.  Imaging tests, such as ultrasound or MRI. How is this treated? Treatment for this condition may include:  Seeing your health care provider for follow-up visits to monitor your fibroids for any changes.  Taking NSAIDs such as ibuprofen, naproxen, or aspirin to reduce pain.  Hormone medicines. These may be taken as a pill, given in an injection, or delivered by a T-shaped device that is inserted into the uterus (intrauterine device, IUD).  Surgery to remove one of the following: ? The fibroids (myomectomy). Your health  care provider may recommend this if fibroids affect your fertility and you want to become pregnant. ? The uterus (hysterectomy). ? Blood supply to the fibroids (uterine artery embolization). Follow these instructions at home:  Take over-the-counter and prescription medicines only as told by your health care provider.  Ask your health care provider if you should take iron pills or eat more iron-rich foods, such as dark green, leafy vegetables. Heavy menstrual bleeding can cause low iron levels.  If directed, apply heat to your back or abdomen to reduce pain. Use the heat source that your health care provider recommends, such as a moist heat pack or a heating pad. ? Place a towel between your skin and the heat source. ? Leave the heat on for 20-30 minutes. ? Remove the heat if your skin turns bright red. This is especially important if you are unable to feel pain, heat, or cold. You may have a greater risk of getting burned.  Pay close attention to your menstrual cycle. Tell your health care provider about any changes, such as: ? Increased blood flow that requires you to use more pads or tampons than usual. ? A change in the number of days that your period lasts. ? A change in symptoms that are associated with your period, such as back pain or cramps in your abdomen.  Keep all follow-up visits as told by your health care provider. This is important, especially if your fibroids need to be monitored for any changes. Contact a health care provider if you:  Have pelvic pain, back pain, or cramps in your abdomen that   do not get better with medicine or heat.  Develop new bleeding between periods.  Have increased bleeding during or between periods.  Feel unusually tired or weak.  Feel light-headed. Get help right away if you:  Faint.  Have pelvic pain that suddenly gets worse.  Have severe vaginal bleeding that soaks a tampon or pad in 30 minutes or less. Summary  Uterine fibroids are  noncancerous (benign) tumors that can develop in the uterus.  The exact cause of this condition is not known.  Most fibroids do not require medical treatment unless they affect your ability to have children (fertility).  Contact a health care provider if you have pelvic pain, back pain, or cramps in your abdomen that do not get better with medicines.  Make sure you know what symptoms should cause you to get help right away. This information is not intended to replace advice given to you by your health care provider. Make sure you discuss any questions you have with your health care provider. Document Released: 10/18/2000 Document Revised: 10/03/2017 Document Reviewed: 09/16/2017 Elsevier Patient Education  2020 [employer]. Abnormal Uterine Bleeding Abnormal uterine bleeding means bleeding more than usual from your uterus. It can include:  Bleeding between periods.  Bleeding after sex.  Bleeding that is heavier than normal.  Periods that last longer than usual.  Bleeding after you have stopped having your period (menopause). There are many problems that may cause this. You should see a doctor for any kind of bleeding that is not normal. Treatment depends on the cause of the bleeding. Follow these instructions at home:  Watch your condition for any changes.  Do not use tampons, douche, or have sex, if your doctor tells you not to.  Change your pads often.  Get regular well-woman exams. Make sure they include a pelvic exam and cervical cancer screening.  Keep all follow-up visits as told by your doctor. This is important. Contact a doctor if:  The bleeding lasts more than one week.  You feel dizzy at times.  You feel like you are going to throw up (nauseous).  You throw up. Get help right away if:  You pass out.  You have to change pads every hour.  You have belly (abdominal) pain.  You have a fever.  You get sweaty.  You get weak.  You passing large blood  clots from your vagina. Summary  Abnormal uterine bleeding means bleeding more than usual from your uterus.  There are many problems that may cause this. You should see a doctor for any kind of bleeding that is not normal.  Treatment depends on the cause of the bleeding. This information is not intended to replace advice given to you by your health care provider. Make sure you discuss any questions you have with your health care provider. Document Released: 08/18/2009 Document Revised: 10/15/2016 Document Reviewed: 10/15/2016 Elsevier Patient Education  2020 [employer].

## 2019-06-24 NOTE — Progress Notes (Signed)
GYNECOLOGY  VISIT   HPI: 43 y.o.   Single  Caucasian  female   [id number] with Patient's last menstrual period was 03/05/2019 (within weeks).   here as a new patient for heavy bleeding x 3-4 months. Patient states that she has seen GYN in [place] (Dr. Derenda Mis) and was prescribed birth control for bleeding, however this has not worked. Patient is unable to remember the name of the medication. Patient has Mirena IUD that was inserted February 2016.   She took her sister's medication to control bleeding, and it was successsful. ?[name]?  Had a Mirena IUD placed years ago to control her heavy bleeding, and it did not stop it.  She states she usually would have menses every 21 days.   She also had abnormal bleeding with Nexplanon and was treated with Provera and this did eventually stop the bleeding.   Currently changes her pad every hour, and she can bleed through her tampon. No pain but does states she does cramp.   Had a pelvic ultrasound about 5 years ago, and she was told it was normal. There was discussion about sonohysterogram, but she did not do this.  Some headache, some fatigue.  No palpitations.  Has night sweats.  Denies urinary leakage with cough, sneeze.  GYNECOLOGIC HISTORY: Patient's last menstrual period was 03/05/2019 (within weeks). Contraception:  Mirena IUD - due for removal in Feb, 2021.  Menopausal hormone therapy:  none Last mammogram:  About 3 years ago per patient; patient has had a cyst removed from left breast in the past Last pap smear:   2016 per patient, normal -- never had abnormal pap smear        OB History    Gravida  3   Para  3   Term  0   Preterm  0   AB  0   Living  3     SAB  0   TAB  0   Ectopic  0   Multiple  0   Live Births  0              Patient Active Problem List   Diagnosis Date Noted  . Depression with anxiety 08/15/2012    Past Medical History:  Diagnosis Date  . Allergy   . Anxiety   . Depression      Past Surgical History:  Procedure Laterality Date  . BREAST SURGERY     cyst removal from L breast  . CESAREAN SECTION     x3  . CHOLECYSTECTOMY      Current Outpatient Medications  Medication Sig Dispense Refill  . clonazePAM (KLONOPIN) 1 MG tablet TAKE 1/2-1 TABLETS BY MOUTH TWICE DAILY AS NEEDED FOR ANXIETY 20 tablet 0  . escitalopram (LEXAPRO) 20 MG tablet Take 1 tablet by mouth daily.    Marland Kitchen ibuprofen (ADVIL,MOTRIN) 800 MG tablet Take 1 tablet (800 mg total) by mouth every 8 (eight) hours as needed for mild pain. 30 tablet 0  . baclofen (LIORESAL) 20 MG tablet Take 1 tablet (20 mg total) by mouth 3 (three) times daily. (Patient not taking: Reported on 06/24/2019) 30 each 0   No current facility-administered medications for this visit.      ALLERGIES: Codeine and Penicillins  Family History  Problem Relation Age of Onset  . Cancer Mother   . Cancer Father     Social History   Socioeconomic History  . Marital status: Single    Spouse name: Not on file  .  Number of children: Not on file  . Years of education: Not on file  . Highest education level: Not on file  Occupational History  . Not on file  Social Needs  . Financial resource strain: Not on file  . Food insecurity    Worry: Not on file    Inability: Not on file  . Transportation needs    Medical: Not on file    Non-medical: Not on file  Tobacco Use  . Smoking status: Never Smoker  . Smokeless tobacco: Never Used  Substance and Sexual Activity  . Alcohol use: No    Alcohol/week: 0.0 standard drinks  . Drug use: No  . Sexual activity: Yes    Birth control/protection: I.U.D.    Comment: Mirena IUD inserted February 2016  Lifestyle  . Physical activity    Days per week: Not on file    Minutes per session: Not on file  . Stress: Not on file  Relationships  . Social Herbalist on phone: Not on file    Gets together: Not on file    Attends religious service: Not on file    Active member  of club or organization: Not on file    Attends meetings of clubs or organizations: Not on file    Relationship status: Not on file  . Intimate partner violence    Fear of current or ex partner: Not on file    Emotionally abused: Not on file    Physically abused: Not on file    Forced sexual activity: Not on file  Other Topics Concern  . Not on file  Social History Narrative   Education: Other. Married. Exercise: 7 days a week    Review of Systems  Constitutional: Negative.   HENT: Negative.   Eyes: Negative.   Respiratory: Negative.   Cardiovascular: Negative.   Gastrointestinal: Negative.   Endocrine: Negative.   Genitourinary: Positive for vaginal bleeding.  Musculoskeletal: Negative.   Skin: Negative.   Allergic/Immunologic: Negative.   Neurological: Negative.   Hematological: Negative.   Psychiatric/Behavioral: Negative.     PHYSICAL EXAMINATION:    BP 118/72 (BP Location: Left Arm, Patient Position: Sitting, Cuff Size: Normal)   Pulse 84   Temp (!) 97.4 F (36.3 C) (Temporal)   Resp 14   Ht 5' 2.5" (1.588 m)   Wt 160 lb 3.2 oz (72.7 kg)   LMP 03/05/2019 (Within Weeks)   BMI 28.83 kg/m     General appearance: alert, cooperative and appears stated age Head: Normocephalic, without obvious abnormality, atraumatic Neck: no adenopathy, supple, symmetrical, trachea midline and thyroid normal to inspection and palpation Lungs: clear to auscultation bilaterally Heart: regular rate and rhythm Abdomen: soft, non-tender, no masses,  no organomegaly Extremities: extremities normal, atraumatic, no cyanosis or edema Skin: Skin color, texture, turgor normal. No rashes or lesions No abnormal inguinal nodes palpated Neurologic: Grossly normal  Pelvic: External genitalia:  no lesions              Urethra:  normal appearing urethra with no masses, tenderness or lesions              Bartholins and Skenes: normal                 Vagina: normal appearing vagina with normal  color and discharge, no lesions.  Vaginal bleeding noted.               Cervix: no lesions. IUD strings noted.  Bimanual Exam:  Uterus:  normal size, contour, position, consistency, mobility, non-tender              Adnexa: no mass, fullness, tenderness              Rectal exam: Yes.  .  Confirms.              Anus:  normal sphincter tone, no lesions  Chaperone was present for exam.  ASSESSMENT  Menorrhagia with irregular bleeding.   PLAN  Needs mammogram and pap.  Will need a possible dx mammogram and [place].  Will get a copy of last mammogram performed.  We discussed abnormal uterine bleeding and possible etiologies.  Return for pelvic US/sonohysterogram and EMB with IUD removal. CBC and TSH.   An After Visit Summary was printed and given to the patient.  ___30___ minutes face to face time of which over 50% was spent in counseling.

## 2019-06-25 LAB — CBC
Hematocrit: 43.5 % (ref 34.0–46.6)
Hemoglobin: 15.1 g/dL (ref 11.1–15.9)
MCH: 30.8 pg (ref 26.6–33.0)
MCHC: 34.7 g/dL (ref 31.5–35.7)
MCV: 89 fL (ref 79–97)
Platelets: 362 10*3/uL (ref 150–450)
RBC: 4.91 x10E6/uL (ref 3.77–5.28)
RDW: 12.8 % (ref 11.7–15.4)
WBC: 9.6 10*3/uL (ref 3.4–10.8)

## 2019-06-25 LAB — TSH: TSH: 1.26 u[IU]/mL (ref 0.450–4.500)

## 2019-06-27 ENCOUNTER — Telehealth: Payer: Self-pay | Admitting: Obstetrics and Gynecology

## 2019-06-27 NOTE — Telephone Encounter (Signed)
Please contact patient to get a copy of her last mammogram performed.  We will need to determine if she needs a follow up diagnostic mammogram or a screening mammogram.  She states her last imaging was about 3 years ago.

## 2019-06-28 ENCOUNTER — Telehealth: Payer: Self-pay | Admitting: Obstetrics and Gynecology

## 2019-06-28 NOTE — Telephone Encounter (Signed)
Call to patient. Message given to patient as seen below from Dr. Quincy Simmonds. Patient should have enough medication to last to PUS on 07-15-2019, will finish the night prior to appointment. Patient updated of results and verbalized understanding.   Will close encounter.

## 2019-06-28 NOTE — Telephone Encounter (Signed)
Spoke with patient in regards to benefit for recommended sonohysterogram, endometrial biopsy and IUD removal. Patient acknowledges understanding of information presented. Patient is scheduled 07/15/2019, with Dr Quincy Simmonds. Patient is aware of the appointment date, arrival time and cancellation policy.  Forwarding to Triage Nurse, see  Previous phone note regarding questions regarding taking medication.

## 2019-06-28 NOTE — Telephone Encounter (Signed)
Patient is currently taking 2 norethindrone tablets. She is still bleeding and want to know if she can take 3.

## 2019-06-28 NOTE — Telephone Encounter (Signed)
Call to patient. Patient states that she started taking the norethindrone on Thursday. States the bleeding seems to be tapering off, but it is still there. States "it's not gushing out, but if I could just take 3 it would stop it." Patient states she is changing her pad/tampon every 2.5 hours and they are saturated, but "not as bad as they were." Patient states before starting norethindrone was having to change pad/tampon every hour. RN advised her body is still adjusting to the hormone, but would review with Dr. Quincy Simmonds and return call with recommendations. Patient agreeable.   Routing to provider for review.

## 2019-06-28 NOTE — Telephone Encounter (Signed)
Call to patient. Patient advised of message as seen below from Dr. Quincy Simmonds. Patient states she will contact [facility] in [place] where she had her MMG done and have them fax a copy of her records. Fax number provided for patient.   Routing to provider and will close encounter.

## 2019-06-28 NOTE — Telephone Encounter (Signed)
Ok to take [name] 5 mg po tid until her ultrasound appointment.  Please be sure she has enough to get her through to this appointment.   I also sent through a result note to inform her of her normal CBC and normal TSH testing.

## 2019-07-02 ENCOUNTER — Telehealth: Payer: Self-pay | Admitting: *Deleted

## 2019-07-02 DIAGNOSIS — R928 Other abnormal and inconclusive findings on diagnostic imaging of breast: Secondary | ICD-10-CM

## 2019-07-02 DIAGNOSIS — N6320 Unspecified lump in the left breast, unspecified quadrant: Secondary | ICD-10-CM

## 2019-07-02 NOTE — Telephone Encounter (Signed)
Call to patient, no answer, voicemail not set up.  MMG report received from [facility] in [place], [place]. MMG report dated 03/06/16, 86mo f/u of left breast was recommended. Patient now needs bilateral Dx MMG and left breast US.   04 recall placed

## 2019-07-02 NOTE — Telephone Encounter (Signed)
Spoke with patient, advised as seen below. Patient request to proceed with breast imaging at [facility]. Advised patient I will call to schedule and return call with appt. Patient request to schedule on a Thursday.    Call placed to [facility], spoke with Joaquim Lai. Bilateral Dx MMG and left breast US scheduled for 07/15/19 at 1pm, arrive at 12:40PM.   Call returned to patient, advised of appt as seen above. Patient is agreeable to date and time. Contact information for TBC provided.  Routing to provider for final review. Patient is agreeable to disposition. Will close encounter.

## 2019-07-13 ENCOUNTER — Other Ambulatory Visit: Payer: Self-pay

## 2019-07-15 ENCOUNTER — Encounter: Payer: Self-pay | Admitting: Obstetrics and Gynecology

## 2019-07-15 ENCOUNTER — Other Ambulatory Visit: Payer: Self-pay | Admitting: Obstetrics and Gynecology

## 2019-07-15 ENCOUNTER — Ambulatory Visit
Admission: RE | Admit: 2019-07-15 | Discharge: 2019-07-15 | Disposition: A | Discharge status: Home / Self Care | Payer: 59 | Source: Ambulatory Visit | Attending: Obstetrics and Gynecology | Admitting: Obstetrics and Gynecology

## 2019-07-15 ENCOUNTER — Ambulatory Visit: Payer: Managed Care, Other (non HMO) | Admitting: Obstetrics and Gynecology

## 2019-07-15 ENCOUNTER — Ambulatory Visit (INDEPENDENT_AMBULATORY_CARE_PROVIDER_SITE_OTHER): Payer: Managed Care, Other (non HMO)

## 2019-07-15 ENCOUNTER — Other Ambulatory Visit: Payer: Self-pay

## 2019-07-15 VITALS — BP 136/84 | HR 70 | Temp 97.6°F | Ht 62.5 in | Wt 163.8 lb

## 2019-07-15 DIAGNOSIS — R928 Other abnormal and inconclusive findings on diagnostic imaging of breast: Secondary | ICD-10-CM

## 2019-07-15 DIAGNOSIS — D219 Benign neoplasm of connective and other soft tissue, unspecified: Secondary | ICD-10-CM | POA: Diagnosis not present

## 2019-07-15 DIAGNOSIS — N921 Excessive and frequent menstruation with irregular cycle: Secondary | ICD-10-CM | POA: Diagnosis not present

## 2019-07-15 DIAGNOSIS — T8332XA Displacement of intrauterine contraceptive device, initial encounter: Secondary | ICD-10-CM

## 2019-07-15 MED ORDER — NORETHINDRONE ACETATE 5 MG PO TABS: 5.0000 mg | ORAL_TABLET | Freq: Three times a day (TID) | ORAL | 0 refills | Status: DC

## 2019-07-15 NOTE — Patient Instructions (Signed)

## 2019-07-15 NOTE — Progress Notes (Signed)
GYNECOLOGY  VISIT   HPI: 43 y.o.   Single  Caucasian  female   G3P0003 with No LMP recorded. (Menstrual status: IUD).   here for   sonohysterogram and EMB due to abnormal uterine bleeding.  Has irregular and heavy menses.  States the Aygesting stopped her bleeding.   Has a Mirena IUD.  She had abnormal bleeding prior to placement of the IUD.  She had bleeding for 6 - 8 months following this and then it stopped.  She then resumed the abnormal bleeding.  Her last pelvic ultrasound was about 5 years ago.  She took birth control pills in the past.  Denies HTN, smoking, migraine with aura, liver or breast disease, personal of FH of thromboembolic events.   UPT negative.   GYNECOLOGIC HISTORY: No LMP recorded. (Menstrual status: IUD). Contraception:  Mirena Menopausal hormone therapy:  NA Last mammogram:  Appointment today. Last pap smear:   2016 - normal per patient.  No hx of abnormal pap.        OB History    Gravida  3   Para  3   Term  0   Preterm  0   AB  0   Living  3     SAB  0   TAB  0   Ectopic  0   Multiple  0   Live Births  0              Patient Active Problem List   Diagnosis Date Noted  . Depression with anxiety 08/15/2012    Past Medical History:  Diagnosis Date  . Allergy   . Anxiety   . Depression     Past Surgical History:  Procedure Laterality Date  . BREAST SURGERY     cyst removal from L breast  . CESAREAN SECTION     x3  . CHOLECYSTECTOMY      Current Outpatient Medications  Medication Sig Dispense Refill  . clonazePAM (KLONOPIN) 0.5 MG tablet Take 1 tablet by mouth 3 (three) times daily.    Marland Kitchen escitalopram (LEXAPRO) 20 MG tablet Take 1 tablet by mouth daily.    Marland Kitchen ibuprofen (ADVIL,MOTRIN) 800 MG tablet Take 1 tablet (800 mg total) by mouth every 8 (eight) hours as needed for mild pain. 30 tablet 0  . norethindrone ([name]) 5 MG tablet Take 1 tablet (5 mg total) by mouth 2 (two) times daily. 60 tablet 0   No current  facility-administered medications for this visit.      ALLERGIES: Codeine and Penicillins  Family History  Problem Relation Age of Onset  . Cancer Mother   . Cancer Father     Social History   Socioeconomic History  . Marital status: Single    Spouse name: Not on file  . Number of children: Not on file  . Years of education: Not on file  . Highest education level: Not on file  Occupational History  . Not on file  Social Needs  . Financial resource strain: Not on file  . Food insecurity    Worry: Not on file    Inability: Not on file  . Transportation needs    Medical: Not on file    Non-medical: Not on file  Tobacco Use  . Smoking status: Never Smoker  . Smokeless tobacco: Never Used  Substance and Sexual Activity  . Alcohol use: No    Alcohol/week: 0.0 standard drinks  . Drug use: No  . Sexual activity: Yes  Birth control/protection: I.U.D.    Comment: Mirena IUD inserted February 2016  Lifestyle  . Physical activity    Days per week: Not on file    Minutes per session: Not on file  . Stress: Not on file  Relationships  . Social Herbalist on phone: Not on file    Gets together: Not on file    Attends religious service: Not on file    Active member of club or organization: Not on file    Attends meetings of clubs or organizations: Not on file    Relationship status: Not on file  . Intimate partner violence    Fear of current or ex partner: Not on file    Emotionally abused: Not on file    Physically abused: Not on file    Forced sexual activity: Not on file  Other Topics Concern  . Not on file  Social History Narrative   Education: Other. Married. Exercise: 7 days a week    Review of Systems  All other systems reviewed and are negative.   PHYSICAL EXAMINATION:    BP 136/84   Pulse 70   Temp 97.6 F (36.4 C) (Temporal)   Ht 5' 2.5" (1.588 m)   Wt 163 lb 12.8 oz (74.3 kg)   BMI 29.48 kg/m     General appearance: alert,  cooperative and appears stated age   Pelvic US Uterus with fibroid 2 cm.  EMS - IUD low in LUS and cervix.  Ovaries normal.  No free fluid.   IUD removal/sonohysterogram/EMB Consent for procedures.  Sterile prep with Hibiclens.  Rings forceps used to remove IUD, intact, shown to patient and discarded.  Cannula placed and NS injected.  No filling defect noted.  Reprep of cervix with Hibiclens.  Paracervical block with 10 cc 1% lidocaine, lot number [id number], exp April 2022.  Pipelle passed to 8.5 cm twice.  Tissue to pathology.  No complications.  Minimal EBL.   Chaperone was present for exam.  ASSESSMENT  Menorrhagia with irregular menses.  Expelling IUD.  Removed. Intramural fibroid.  PLAN  We discussed her expelling IUD and the fibroid. FU EMB.  Post procedure instructions to the patient.  Continue [name] 5 mg po tid until follow up visit for annual exam. She will return for her well woman visit in 10 - 14 days.  If her biopsy supports tx with OCPs, this may be the next step in her care. She understands she needs to currently use condoms for pregnancy prevention.  She is going to update her mammogram today.   An After Visit Summary was printed and given to the patient.  ___15___ minutes face to face time of which over 50% was spent in counseling.

## 2019-07-19 ENCOUNTER — Telehealth: Payer: Self-pay | Admitting: Obstetrics and Gynecology

## 2019-07-19 NOTE — Telephone Encounter (Signed)
Is there any chance she could come in tomorrow, September 15, at 4:30 in the afternoon?

## 2019-07-19 NOTE — Telephone Encounter (Signed)
Patient states she is currently taking aygestin and is still having bleeding. Patient states her bleeding is very bad with clotting and is changing her pad every 3 hours. Please advise.

## 2019-07-19 NOTE — Telephone Encounter (Signed)
Call to patient. Patient states she is still taking the [name] 3 times a day. States the bleeding had stopped until her IUD was removed on Thursday. Patient states she is changing a saturated pad every 3 hours. Passing "dime sized" clots. States the bleeding gets heavier day by day. Complaining of cramping and rates as 4/10. Not currently taking any OTC pain medication. Denies fever. Denies SOB, light-headedness or dizziness. Patient states she can't be seen prior to Thursday. RN advised would need to review Dr. Elza Rafter schedule with her and return call. Patient agreeable. Pain/bleeding/anemia/fever symptoms reviewed with patient and she verbalized understanding.   Routing to provider for review.   Also reviewed surgical pathology results with patient from 07-15-2019 and she verbalized understanding. See result note.

## 2019-07-20 NOTE — Telephone Encounter (Signed)
Left message to call Taggert Bozzi, RN at [facility] [phone].   

## 2019-07-21 NOTE — Telephone Encounter (Signed)
Spoke with patient. Patient declines OV for today at 4:30pm, states she can only be seen on Thursdays. OV scheduled for 07/29/19 at 4:30pm with Dr. Quincy Simmonds. Also scheduled for AEX 9/28 at 8am.   Patient states she increased her [name] 5mg  to four times a day, bleeding has slowed to spotting. Advised patient not recommended to increase medication without evaluation of bleeding. Denies any other symptoms. Advised patient Dr. Quincy Simmonds will review, our office will return call with recommendations. Patient agreeable.   Dr. Quincy Simmonds -please review and advise.

## 2019-07-21 NOTE — Telephone Encounter (Signed)
I recommend the patient reduce the [name] down to three times a day.  If she has continued heavy bleeding, she needs office evaluation.

## 2019-07-21 NOTE — Telephone Encounter (Signed)
Call to patient. Message given to patient as seen below from Dr. Quincy Simmonds and patient verbalized understanding. Will keep appointment as scheduled for 07-29-2019 and call office sooner if bleeding becomes heavy or pain develops. Patient agreeable.   Routing to provider and will close encounter.

## 2019-07-21 NOTE — Telephone Encounter (Signed)
Patient returned call

## 2019-07-22 NOTE — Progress Notes (Signed)
43 y.o. G22P0003 Single Caucasian female here for annual exam.    Stopped her active bleeding but having daily brown discharge.  No pain.   Pelvic US showed a 25 mm fibroid. Her IUD was expelling so I removed it at the [place] visit.   Had recent EMB and this showed progesterone effect.   She took COCs in the past and she did well in the past.  She has no contraindications.   Not sexually active since her EMB. Declines STD screening.  No change in partner.   PCP: [facility]  No LMP recorded. (Menstrual status: Irregular Periods).     Period Pattern: (!) Irregular     Sexually active: No.  The current method of family planning is abstinence/pt. On [name].    Exercising: No.  The patient does not participate in regular exercise at present. Smoker:  no  Health Maintenance: Pap: 2016 Neg per patient History of abnormal Pap:  no MMG: 07-15-19 Diag.Bil.w/Lt.Br.US--Rt.Br.Neg, Lt.Br.Benign cysts decreased in size since 2017/density C/BiRads2 Colonoscopy:  n/a BMD:   n/a  Result  n/a TDaP: ??17 years ago--would like today. Gardasil:   no HIV:Neg during pregnancy Hep C: Neg during pregnancy Screening Labs:   Today.    reports that she has never smoked. She has never used smokeless tobacco. She reports that she does not drink alcohol or use drugs.  Past Medical History:  Diagnosis Date  . Allergy   . Anxiety   . Depression     Past Surgical History:  Procedure Laterality Date  . BREAST SURGERY     cyst removal from L breast  . CESAREAN SECTION     x3  . CHOLECYSTECTOMY      Current Outpatient Medications  Medication Sig Dispense Refill  . clonazePAM (KLONOPIN) 0.5 MG tablet Take 1 tablet by mouth 3 (three) times daily.    Marland Kitchen escitalopram (LEXAPRO) 20 MG tablet Take 1 tablet by mouth daily.    Marland Kitchen ibuprofen (ADVIL,MOTRIN) 800 MG tablet Take 1 tablet (800 mg total) by mouth every 8 (eight) hours as needed for mild pain. 30 tablet 0  . norethindrone ([name]) 5 MG  tablet Take 1 tablet (5 mg total) by mouth 3 (three) times daily. 90 tablet 0   No current facility-administered medications for this visit.     Family History  Problem Relation Age of Onset  . Cancer Mother   . Cancer Father   . Breast cancer Sister        half sister     Review of Systems  All other systems reviewed and are negative.   Exam:   BP 120/74   Pulse 76   Temp 98 F (36.7 C) (Temporal)   Resp 16   Ht 5\' 3"  (1.6 m)   Wt 162 lb (73.5 kg)   BMI 28.70 kg/m     General appearance: alert, cooperative and appears stated age Head: normocephalic, without obvious abnormality, atraumatic Neck: no adenopathy, supple, symmetrical, trachea midline and thyroid normal to inspection and palpation Lungs: clear to auscultation bilaterally Breasts: normal appearance, no masses or tenderness, No nipple retraction or dimpling, No nipple discharge or bleeding, No axillary adenopathy Heart: regular rate and rhythm Abdomen: soft, non-tender; no masses, no organomegaly Extremities: extremities normal, atraumatic, no cyanosis or edema Skin: skin color, texture, turgor normal. No rashes or lesions Lymph nodes: cervical, supraclavicular, and axillary nodes normal. Neurologic: grossly normal  Pelvic: External genitalia:  no lesions  No abnormal inguinal nodes palpated.              Urethra:  normal appearing urethra with no masses, tenderness or lesions              Bartholins and Skenes: normal                 Vagina: normal appearing vagina with normal color and discharge, no lesions              Cervix: no lesions              Pap taken: Yes.   Bimanual Exam:  Uterus:  normal size, contour, position, consistency, mobility, non-tender              Adnexa: no mass, fullness, tenderness              Rectal exam: Yes.  .  Confirms.              Anus:  normal sphincter tone, no lesions  Chaperone was present for exam.  Assessment:   Well woman visit with normal  exam. Small uterine fibroid.  Probable anovulatory bleeding.  Benign EMB.  Plan: Mammogram screening discussed. Self breast awareness reviewed. Pap and HR HPV as above. Guidelines for Calcium, Vitamin D, regular exercise program including cardiovascular and weight bearing exercise. Tdap.  Routine labs - CMP, lipids.  Start Seasonale.  Warning signs discussed.  She will let me know if her bleeding does not normalize. Follow up annually and prn.   After visit summary provided.

## 2019-07-27 NOTE — Progress Notes (Deleted)
GYNECOLOGY  VISIT   HPI: 43 y.o.   Single  {Race/ethnicity:17218}  female   [id number] with No LMP recorded. (Menstrual status: IUD).   here for   Abnormal bleeding   GYNECOLOGIC HISTORY: No LMP recorded. (Menstrual status: IUD). Contraception:  Mirena IUD  Menopausal hormone therapy:  n/a Last mammogram:  07-15-2019 left breast US density D/BIRADS 2 benign  Last pap smear:   2016 per patient, normal -- never had abnormal pap smear         OB History    Gravida  3   Para  3   Term  0   Preterm  0   AB  0   Living  3     SAB  0   TAB  0   Ectopic  0   Multiple  0   Live Births  0              Patient Active Problem List   Diagnosis Date Noted  . Depression with anxiety 08/15/2012    Past Medical History:  Diagnosis Date  . Allergy   . Anxiety   . Depression     Past Surgical History:  Procedure Laterality Date  . BREAST SURGERY     cyst removal from L breast  . CESAREAN SECTION     x3  . CHOLECYSTECTOMY      Current Outpatient Medications  Medication Sig Dispense Refill  . clonazePAM (KLONOPIN) 0.5 MG tablet Take 1 tablet by mouth 3 (three) times daily.    Marland Kitchen escitalopram (LEXAPRO) 20 MG tablet Take 1 tablet by mouth daily.    Marland Kitchen ibuprofen (ADVIL,MOTRIN) 800 MG tablet Take 1 tablet (800 mg total) by mouth every 8 (eight) hours as needed for mild pain. 30 tablet 0  . norethindrone ([name]) 5 MG tablet Take 1 tablet (5 mg total) by mouth 3 (three) times daily. 90 tablet 0   No current facility-administered medications for this visit.      ALLERGIES: Codeine and Penicillins  Family History  Problem Relation Age of Onset  . Cancer Mother   . Cancer Father   . Breast cancer Sister        half sister     Social History   Socioeconomic History  . Marital status: Single    Spouse name: Not on file  . Number of children: Not on file  . Years of education: Not on file  . Highest education level: Not on file  Occupational History  . Not on  file  Social Needs  . Financial resource strain: Not on file  . Food insecurity    Worry: Not on file    Inability: Not on file  . Transportation needs    Medical: Not on file    Non-medical: Not on file  Tobacco Use  . Smoking status: Never Smoker  . Smokeless tobacco: Never Used  Substance and Sexual Activity  . Alcohol use: No    Alcohol/week: 0.0 standard drinks  . Drug use: No  . Sexual activity: Yes    Birth control/protection: I.U.D.    Comment: Mirena IUD inserted February 2016  Lifestyle  . Physical activity    Days per week: Not on file    Minutes per session: Not on file  . Stress: Not on file  Relationships  . Social Herbalist on phone: Not on file    Gets together: Not on file    Attends religious service: Not on  file    Active member of club or organization: Not on file    Attends meetings of clubs or organizations: Not on file    Relationship status: Not on file  . Intimate partner violence    Fear of current or ex partner: Not on file    Emotionally abused: Not on file    Physically abused: Not on file    Forced sexual activity: Not on file  Other Topics Concern  . Not on file  Social History Narrative   Education: Other. Married. Exercise: 7 days a week    Review of Systems  Constitutional: Negative.   HENT: Negative.   Eyes: Negative.   Respiratory: Negative.   Cardiovascular: Negative.   Gastrointestinal: Negative.   Endocrine: Negative.   Musculoskeletal: Negative.   Skin: Negative.   Allergic/Immunologic: Negative.   Neurological: Negative.   Hematological: Negative.   Psychiatric/Behavioral: Negative.     PHYSICAL EXAMINATION:    There were no vitals taken for this visit.    General appearance: alert, cooperative and appears stated age Head: Normocephalic, without obvious abnormality, atraumatic Neck: no adenopathy, supple, symmetrical, trachea midline and thyroid normal to inspection and palpation Lungs: clear to  auscultation bilaterally Breasts: normal appearance, no masses or tenderness, No nipple retraction or dimpling, No nipple discharge or bleeding, No axillary or supraclavicular adenopathy Heart: regular rate and rhythm Abdomen: soft, non-tender, no masses,  no organomegaly Extremities: extremities normal, atraumatic, no cyanosis or edema Skin: Skin color, texture, turgor normal. No rashes or lesions Lymph nodes: Cervical, supraclavicular, and axillary nodes normal. No abnormal inguinal nodes palpated Neurologic: Grossly normal  Pelvic: External genitalia:  no lesions              Urethra:  normal appearing urethra with no masses, tenderness or lesions              Bartholins and Skenes: normal                 Vagina: normal appearing vagina with normal color and discharge, no lesions              Cervix: no lesions                Bimanual Exam:  Uterus:  normal size, contour, position, consistency, mobility, non-tender              Adnexa: no mass, fullness, tenderness              Rectal exam: {yes no:314532}.  Confirms.              Anus:  normal sphincter tone, no lesions  Chaperone was present for exam.  ASSESSMENT     PLAN     An After Visit Summary was printed and given to the patient.  ______ minutes face to face time of which over 50% was spent in counseling.

## 2019-07-28 ENCOUNTER — Telehealth: Payer: Self-pay | Admitting: Obstetrics and Gynecology

## 2019-07-28 NOTE — Telephone Encounter (Signed)
Spoke with patient. Patient states she is taking [name] 5mg  tid, bleeding has stopped, has to take [name] on time or bleeding occurs. Patient denies any other GYN symptoms. Patient asking if AEX and OV can be combined, AEX is scheduled for 9/28 at 8am. Advised if bleeding has stopped and no new symptoms ok to see Dr. Quincy Simmonds for AEX on 9/28. Patient request to cancel OV for 9/24, OV cancelled. Advised patient to keep AEX as scheduled, return call if bleeding starts or any new symptoms. Advised Dr. Quincy Simmonds will review, our office will return call if any additional recommendations.   Routing to provider for final review. Patient is agreeable to disposition. Will close encounter.

## 2019-07-28 NOTE — Telephone Encounter (Signed)
Patient has questions regarding her appointment for abnormal bleeding tomorrow.

## 2019-07-29 ENCOUNTER — Ambulatory Visit: Payer: Self-pay | Admitting: Obstetrics and Gynecology

## 2019-08-02 ENCOUNTER — Other Ambulatory Visit: Payer: Self-pay

## 2019-08-02 ENCOUNTER — Other Ambulatory Visit (HOSPITAL_COMMUNITY)
Admission: RE | Admit: 2019-08-02 | Discharge: 2019-08-02 | Disposition: A | Discharge status: Home / Self Care | Payer: 59 | Source: Ambulatory Visit | Attending: Obstetrics and Gynecology | Admitting: Obstetrics and Gynecology

## 2019-08-02 ENCOUNTER — Ambulatory Visit (INDEPENDENT_AMBULATORY_CARE_PROVIDER_SITE_OTHER): Payer: Managed Care, Other (non HMO) | Admitting: Obstetrics and Gynecology

## 2019-08-02 ENCOUNTER — Encounter: Payer: Self-pay | Admitting: Obstetrics and Gynecology

## 2019-08-02 VITALS — BP 120/74 | HR 76 | Temp 98.0°F | Resp 16 | Ht 63.0 in | Wt 162.0 lb

## 2019-08-02 DIAGNOSIS — Z01419 Encounter for gynecological examination (general) (routine) without abnormal findings: Secondary | ICD-10-CM | POA: Insufficient documentation

## 2019-08-02 DIAGNOSIS — Z23 Encounter for immunization: Secondary | ICD-10-CM | POA: Diagnosis not present

## 2019-08-02 MED ORDER — LEVONORGEST-ETH ESTRAD 91-DAY 0.15-0.03 MG PO TABS: 1.0000 | ORAL_TABLET | Freq: Every day | ORAL | 3 refills | Status: DC

## 2019-08-02 NOTE — Patient Instructions (Signed)

## 2019-08-03 LAB — COMPREHENSIVE METABOLIC PANEL
ALT: 14 IU/L (ref 0–32)
AST: 6 IU/L (ref 0–40)
Albumin/Globulin Ratio: 1.5 (ref 1.2–2.2)
Albumin: 4 g/dL (ref 3.8–4.8)
Alkaline Phosphatase: 95 IU/L (ref 39–117)
BUN/Creatinine Ratio: 11 (ref 9–23)
BUN: 10 mg/dL (ref 6–24)
Bilirubin Total: 0.7 mg/dL (ref 0.0–1.2)
CO2: 20 mmol/L (ref 20–29)
Calcium: 9 mg/dL (ref 8.7–10.2)
Chloride: 104 mmol/L (ref 96–106)
Creatinine, Ser: 0.87 mg/dL (ref 0.57–1.00)
GFR calc Af Amer: 93 mL/min/{1.73_m2} (ref >59)
GFR calc non Af Amer: 81 mL/min/{1.73_m2} (ref >59)
Globulin, Total: 2.6 g/dL (ref 1.5–4.5)
Glucose: 77 mg/dL (ref 65–99)
Potassium: 3.8 mmol/L (ref 3.5–5.2)
Sodium: 138 mmol/L (ref 134–144)
Total Protein: 6.6 g/dL (ref 6.0–8.5)

## 2019-08-03 LAB — LIPID PANEL
Chol/HDL Ratio: 8 ratio — ABNORMAL HIGH (ref 0.0–4.4)
Cholesterol, Total: 208 mg/dL — ABNORMAL HIGH (ref 100–199)
HDL: 26 mg/dL — ABNORMAL LOW (ref >39)
LDL Chol Calc (NIH): 156 mg/dL — ABNORMAL HIGH (ref 0–99)
Triglycerides: 139 mg/dL (ref 0–149)
VLDL Cholesterol Cal: 26 mg/dL (ref 5–40)

## 2019-08-04 ENCOUNTER — Encounter: Payer: Self-pay | Admitting: Obstetrics and Gynecology

## 2019-08-05 LAB — CYTOLOGY - PAP
Diagnosis: UNDETERMINED — AB
High risk HPV: POSITIVE — AB
Molecular Disclaimer: 56
Molecular Disclaimer: NORMAL

## 2019-08-06 ENCOUNTER — Other Ambulatory Visit: Payer: Self-pay | Admitting: *Deleted

## 2019-08-06 ENCOUNTER — Telehealth: Payer: Self-pay | Admitting: Obstetrics and Gynecology

## 2019-08-06 DIAGNOSIS — R87810 Cervical high risk human papillomavirus (HPV) DNA test positive: Secondary | ICD-10-CM

## 2019-08-06 DIAGNOSIS — R87610 Atypical squamous cells of undetermined significance on cytologic smear of cervix (ASC-US): Secondary | ICD-10-CM

## 2019-08-06 NOTE — Telephone Encounter (Signed)
Patient has questions regarding her HPV results.

## 2019-08-06 NOTE — Telephone Encounter (Signed)
Spoke with patient. Patient states she has "googled" Hpv and has additional questions. Reviewed 08/02/19 pap and hpv results again with patient, questions answered. Patient still has additional concerns about hpv. Offered WebEx consult with provider to further discuss, patient declined. Provided patient with CDC.gov website for current and up to date info regarding HPV. Advised patient to return call to office if OV or WebEx visit desired to further discuss.   Routing to provider for final review. Patient is agreeable to disposition. Will close encounter.

## 2019-08-09 ENCOUNTER — Other Ambulatory Visit: Payer: Self-pay | Admitting: Obstetrics and Gynecology

## 2019-08-09 MED ORDER — NORETHINDRONE ACETATE 5 MG PO TABS: 5.0000 mg | ORAL_TABLET | Freq: Three times a day (TID) | ORAL | 0 refills | Status: DC

## 2019-08-09 NOTE — Telephone Encounter (Signed)
Have her stop the birth control pills. She can start [name] 5 mg po tid to stop her bleeding.  Please schedule her colposcopy with me for next week.

## 2019-08-09 NOTE — Telephone Encounter (Signed)
Patient states she is still bleeding heavily but would like to schedule colposcopy as quickly as possible. Would like to start medication to stop her bleeding if possible. Please advise

## 2019-08-09 NOTE — Telephone Encounter (Signed)
Call to patient. Patient states that she started the seasonale on 08-05-2019. States she feels that her bleeding has gotten worse. Changing a saturated pad/tampon every 1-2 hours. States she is also having "very bad cramping." Patient requesting something to stop the bleeding so she can proceed asap with colposcopy. Patient states, "this is tearing my nerves up. I can't sleep at night." RN advised would review with Dr. Quincy Simmonds and return call with additional recommendations. Patient agreeable.   Routing to provider for review.

## 2019-08-09 NOTE — Telephone Encounter (Signed)
Call to patient. Message given to patient as seen below from Dr. Quincy Simmonds and patient verbalized understanding. Patient requesting prescription for [name] to be sent to [facility] to get her to her appointment. Reviewed schedule with [facility], patient scheduled for Thursday 08-19-2019 at 1130. Patient agreeable to date and time of appointment. Future order present for Colposcopy. Jennye Moccasin, RN reviewed procedure with patient on 08-06-2019. See result note.   Prescription pended for [name] #90, 0RF to [facility].   Routing to provider to review and sign prescription.

## 2019-08-10 ENCOUNTER — Telehealth: Payer: Self-pay | Admitting: Obstetrics and Gynecology

## 2019-08-10 NOTE — Telephone Encounter (Signed)
Call placed to patient to convey benefits for colposcopy. Spoke with patient she understands/agreeable with the benefits. Patient aware of cancellation policy. Appointment scheduled 08/19/19.

## 2019-08-12 ENCOUNTER — Telehealth: Payer: Self-pay | Admitting: Obstetrics and Gynecology

## 2019-08-12 NOTE — Telephone Encounter (Signed)
A prescription for [name] 5 mg po tid was already sent to [facility].  Ok to change to other pharmacy if she desires.

## 2019-08-12 NOTE — Telephone Encounter (Signed)
Patient says the medication to stop her from bleeding is not at the pharmacy. [facility] at [phone].

## 2019-08-12 NOTE — Telephone Encounter (Signed)
Call to patient. States she is expecting new prescription of [name] to be called to pharamcy to stop bleeding for upcoming colpo. Currently on Seasonale and bleeding. States she was told to switch back to [name] to stop bleeding for colpo next week.  No longer has previous [name] prescription.   Please advise.

## 2019-08-13 MED ORDER — NORETHINDRONE ACETATE 5 MG PO TABS: 5.0000 mg | ORAL_TABLET | Freq: Three times a day (TID) | ORAL | 0 refills | Status: DC

## 2019-08-13 NOTE — Telephone Encounter (Signed)
Spoke with patient. Patient request Rx to [facility] on file. Pharmacy updated, new Rx sent. Patient verbalizes understanding and thankful for call.   Call placed to [facility], spoke with Tripp, Rx cancelled for [name] 5mg  tab received on 08/09/19.   Routing to provider for final review. Patient is agreeable to disposition. Will close encounter.

## 2019-08-16 ENCOUNTER — Ambulatory Visit: Payer: Managed Care, Other (non HMO) | Admitting: Obstetrics and Gynecology

## 2019-08-17 ENCOUNTER — Other Ambulatory Visit: Payer: Self-pay | Admitting: Obstetrics and Gynecology

## 2019-08-17 ENCOUNTER — Other Ambulatory Visit: Payer: Self-pay

## 2019-08-17 NOTE — Progress Notes (Signed)
  Subjective:     Patient ID: Mikal Plane, female   DOB: 04/06/1976, 43 y.o.   MRN: [id number]  HPI  Patient here today for colposcopy with pap 08-02-19 ASCUS:Pos HR HPV.  She is currently on [name] for treatment of abnormal uterine bleeding.  She had a Mirena IUD which was starting to expel on [place], so I removed it.  I did have her on COCs, but her bleeding was not well controlled, so I placed her on [name] in order to accomplish the colposcopy.  Her EMB showed benign progestational change. She has a 25 mm anterior left fibroid.  Review of Systems  All other systems reviewed and are negative.   LMP: 08-13-19 Contraception: Abstinence UPT: Neg     Objective:   Physical Exam Genitourinary:     Colposcopy - cervix, vagina. Consent for procedure.  3% acetic acid used in vagina. White light and green light filter used.  Colposcopy satisfactory:  Yes   __x___          No    _____ Findings:    Cervix:  Large transformation zone.  Ring of acetowhite change around the cervix.  Abnormal vessels at 12:00 and 6:00. Vagina: no lesions.   Biopsies:   ECC, 6:00 and 12:00. Monsel's placed.  Minimal EBL. No complications.   Difficult to see the cervix well with Graves and large Pederson speculums.    Assessment:     ASCUS, positive HR HPV.  Abnormal uterine bleeding, currently on [name] 5 mg po tid.  Small uterine fibroid.    Plan:     FU biopsies.  Post procedure instructions given.  I discussed HPV, abnormal paps, colposcopy and conization.  If needs treatment, I would recommend an out patient surgical center setting.  Continue [name].  Final plan for bleeding to follow after colpo biopsies are back.

## 2019-08-19 ENCOUNTER — Ambulatory Visit: Payer: Managed Care, Other (non HMO) | Admitting: Obstetrics and Gynecology

## 2019-08-19 ENCOUNTER — Other Ambulatory Visit: Payer: Self-pay | Admitting: Obstetrics and Gynecology

## 2019-08-19 ENCOUNTER — Encounter: Payer: Self-pay | Admitting: Obstetrics and Gynecology

## 2019-08-19 ENCOUNTER — Other Ambulatory Visit: Payer: Self-pay

## 2019-08-19 VITALS — BP 130/80 | HR 80 | Temp 97.6°F | Ht 63.0 in | Wt 161.8 lb

## 2019-08-19 DIAGNOSIS — Z01812 Encounter for preprocedural laboratory examination: Secondary | ICD-10-CM

## 2019-08-19 DIAGNOSIS — R87610 Atypical squamous cells of undetermined significance on cytologic smear of cervix (ASC-US): Secondary | ICD-10-CM

## 2019-08-19 DIAGNOSIS — R87810 Cervical high risk human papillomavirus (HPV) DNA test positive: Secondary | ICD-10-CM

## 2019-08-19 LAB — POCT URINE PREGNANCY: Preg Test, Ur: NEGATIVE

## 2019-08-19 NOTE — Patient Instructions (Signed)

## 2019-08-25 ENCOUNTER — Telehealth: Payer: Self-pay | Admitting: Obstetrics and Gynecology

## 2019-08-25 NOTE — Telephone Encounter (Signed)
You are correct, the dosage for the [name] in 5 mg po tid.

## 2019-08-25 NOTE — Telephone Encounter (Signed)
Patient wanting to know if test results are in.

## 2019-08-25 NOTE — Telephone Encounter (Signed)
Of for one month refill of [name] 50 mg po tid.  Disp:  90 RF:  None.

## 2019-08-25 NOTE — Telephone Encounter (Signed)
Spoke with patient, advised of results as seen below per Dr. Quincy Simmonds.  OV scheduled for 10/29 at 4:30pm. Patient can only schedule on Thursdays.   Currently taking [name] 5 mg tab tid, requesting refill until OV. Confirmed pharmacy on file. Advised patient I will review with Dr. Quincy Simmonds and return call, patient agreeable.   12 recall placed.   Dr. Quincy Simmonds -please advise on RX.

## 2019-08-25 NOTE — Telephone Encounter (Signed)
Medication pended for 5 mg aygestin #90, 0RF to [facility] on [address] in [place].   Routing to provider to review and advise on dosage.

## 2019-08-25 NOTE — Telephone Encounter (Signed)
-----   Message from Nunzio Cobbs, MD sent at 08/23/2019  5:16 PM EDT ----- Please contact patient with results of colposcopy showing atypia.  No cancer was seen.  She needs a recall for 12 months for her next Pap/HPV.  Her pap showed atypia and positive HR HPV.   The patient has had abnormal uterine bleeding which we are currently treating as well.  I would like to have her return for an office visit to determine next steps in her care.  She tried to come off [name] and switch to COCs, and this was not successful.

## 2019-08-26 MED ORDER — NORETHINDRONE ACETATE 5 MG PO TABS: 5.0000 mg | ORAL_TABLET | Freq: Three times a day (TID) | ORAL | 0 refills | Status: DC

## 2019-08-26 NOTE — Telephone Encounter (Signed)
[name] RX to pharmacy on file.   Patient notified.   Encounter closed.

## 2019-08-27 ENCOUNTER — Ambulatory Visit: Payer: Managed Care, Other (non HMO) | Admitting: Obstetrics and Gynecology

## 2019-08-31 ENCOUNTER — Other Ambulatory Visit: Payer: Self-pay

## 2019-09-02 ENCOUNTER — Ambulatory Visit (INDEPENDENT_AMBULATORY_CARE_PROVIDER_SITE_OTHER): Payer: Managed Care, Other (non HMO) | Admitting: Obstetrics and Gynecology

## 2019-09-02 ENCOUNTER — Other Ambulatory Visit: Payer: Self-pay

## 2019-09-02 ENCOUNTER — Encounter: Payer: Self-pay | Admitting: Obstetrics and Gynecology

## 2019-09-02 VITALS — BP 110/74 | HR 70 | Temp 97.0°F | Ht 63.0 in | Wt 161.0 lb

## 2019-09-02 DIAGNOSIS — N879 Dysplasia of cervix uteri, unspecified: Secondary | ICD-10-CM | POA: Diagnosis not present

## 2019-09-02 DIAGNOSIS — Z3141 Encounter for fertility testing: Secondary | ICD-10-CM

## 2019-09-02 DIAGNOSIS — N939 Abnormal uterine and vaginal bleeding, unspecified: Secondary | ICD-10-CM

## 2019-09-02 MED ORDER — NORETHINDRONE ACETATE 5 MG PO TABS: 5.0000 mg | ORAL_TABLET | Freq: Three times a day (TID) | ORAL | 1 refills | Status: DC

## 2019-09-02 NOTE — Progress Notes (Signed)
GYNECOLOGY  VISIT   HPI: 43 y.o.   Single  Caucasian  female   [id number] with Patient's last menstrual period was 08/13/2019 (approximate).   here for consult regarding abnormal uterine bleeding.  Back on [name] 5 mg po tid to control her bleeding.  Had Mirena IUD.   Asking if fertility is still possible.  GYNECOLOGIC HISTORY: Patient's last menstrual period was 08/13/2019 (approximate). Contraception:  Abstinence/Pt.is on [name] Menopausal hormone therapy: none Last mammogram:  07-15-19 Diag.Bil.& Lt.Br.US--3D/benign cysts Lt.br.decreased in size since 2017/Neg/density D/screening 62yr/BiRads2 Last pap smear: 08-02-19 ASCUS:Pos HR HPV; colpo bx showing atypia        OB History    Gravida  3   Para  3   Term  0   Preterm  0   AB  0   Living  3     SAB  0   TAB  0   Ectopic  0   Multiple  0   Live Births  0              Patient Active Problem List   Diagnosis Date Noted  . Depression with anxiety 08/15/2012    Past Medical History:  Diagnosis Date  . Allergy   . Anxiety   . Depression   . Dyslipidemia (high LDL; low HDL)     Past Surgical History:  Procedure Laterality Date  . BREAST SURGERY     cyst removal from L breast  . CESAREAN SECTION     x3  . CHOLECYSTECTOMY      Current Outpatient Medications  Medication Sig Dispense Refill  . clonazePAM (KLONOPIN) 0.5 MG tablet Take 1 tablet by mouth 3 (three) times daily.    Marland Kitchen escitalopram (LEXAPRO) 20 MG tablet Take 1 tablet by mouth daily.    Marland Kitchen ibuprofen (ADVIL,MOTRIN) 800 MG tablet Take 1 tablet (800 mg total) by mouth every 8 (eight) hours as needed for mild pain. 30 tablet 0  . norethindrone ([name]) 5 MG tablet Take 1 tablet (5 mg total) by mouth 3 (three) times daily. 90 tablet 0   No current facility-administered medications for this visit.      ALLERGIES: Codeine and Penicillins  Family History  Problem Relation Age of Onset  . Cancer Mother   . Cancer Father   . Breast cancer  Sister        half sister     Social History   Socioeconomic History  . Marital status: Single    Spouse name: Not on file  . Number of children: Not on file  . Years of education: Not on file  . Highest education level: Not on file  Occupational History  . Not on file  Social Needs  . Financial resource strain: Not on file  . Food insecurity    Worry: Not on file    Inability: Not on file  . Transportation needs    Medical: Not on file    Non-medical: Not on file  Tobacco Use  . Smoking status: Never Smoker  . Smokeless tobacco: Never Used  Substance and Sexual Activity  . Alcohol use: No    Alcohol/week: 0.0 standard drinks  . Drug use: No  . Sexual activity: Yes    Birth control/protection: Abstinence  Lifestyle  . Physical activity    Days per week: Not on file    Minutes per session: Not on file  . Stress: Not on file  Relationships  . Social connections    Talks  on phone: Not on file    Gets together: Not on file    Attends religious service: Not on file    Active member of club or organization: Not on file    Attends meetings of clubs or organizations: Not on file    Relationship status: Not on file  . Intimate partner violence    Fear of current or ex partner: Not on file    Emotionally abused: Not on file    Physically abused: Not on file    Forced sexual activity: Not on file  Other Topics Concern  . Not on file  Social History Narrative   Education: Other. Married. Exercise: 7 days a week    Review of Systems  All other systems reviewed and are negative.   PHYSICAL EXAMINATION:    BP 110/74   Pulse 70   Temp (!) 97 F (36.1 C) (Temporal)   Ht 5\' 3"  (1.6 m)   Wt 161 lb (73 kg)   LMP 08/13/2019 (Approximate)   BMI 28.52 kg/m     General appearance: alert, cooperative and appears stated age    ASSESSMENT  Abnormal uterine bleeding.  Interested in fertility.  Cervical atypia.  AMA status.   PLAN  We discussed alternatives to  [name] - hysteroscopy with dilation and curettage, endometrial ablation, hysterectomy.  AMH now.  We discussed her AMA status.  Continue [name] 5 mg po tid.  FU in 3 months/ Pap/HPV testing in 12 months.    An After Visit Summary was printed and given to the patient.  ___15___ minutes face to face time of which over 50% was spent in counseling.

## 2019-09-07 ENCOUNTER — Telehealth: Payer: Self-pay | Admitting: Obstetrics and Gynecology

## 2019-09-07 NOTE — Telephone Encounter (Signed)
Patient is calling for her recent results. °

## 2019-09-07 NOTE — Telephone Encounter (Signed)
Spoke with Diane May. Diane May wanting [place] lab results from [place] 10/29. Lab results not back, but assured Diane May will be hearing from office as soon as results are in. Diane May wanted to go ahead and schedule 3 month follow up to discuss options from [facility] results. Scheduled OV 12/02/19 at 2:30pm with Dr Quincy Simmonds. Diane May agreeable.  Routing to provider for final review. Patient is agreeable to disposition. Will close encounter.

## 2019-09-08 LAB — ANTI MULLERIAN HORMONE: ANTI-MULLERIAN HORMONE (AMH): <0.015 ng/mL

## 2019-09-21 ENCOUNTER — Other Ambulatory Visit: Payer: Self-pay | Admitting: Obstetrics and Gynecology

## 2019-10-26 ENCOUNTER — Other Ambulatory Visit: Payer: Self-pay | Admitting: Obstetrics and Gynecology

## 2019-10-26 MED ORDER — NORETHINDRONE ACETATE 5 MG PO TABS: 5.0000 mg | ORAL_TABLET | Freq: Three times a day (TID) | ORAL | 0 refills | Status: DC

## 2019-10-26 NOTE — Telephone Encounter (Signed)
Medication refill request: [name]  Last AEX:  08-02-2019 BS  Next OV: 11-25-19 Last MMG (if hormonal medication request): 07-15-2019 density D/BIRADS 2 benign  Refill authorized: Today, please advise.   Medication pended for #90, 0RF. Please refill if appropriate.

## 2019-10-26 NOTE — Telephone Encounter (Signed)
Patient is calling regarding refill request for norethindrone. Patient stated that she has 2 pills left.

## 2019-11-29 ENCOUNTER — Other Ambulatory Visit: Payer: Self-pay

## 2019-12-02 ENCOUNTER — Ambulatory Visit: Payer: Self-pay | Admitting: Obstetrics and Gynecology

## 2019-12-02 ENCOUNTER — Other Ambulatory Visit: Payer: Self-pay

## 2019-12-02 ENCOUNTER — Ambulatory Visit
Admission: EM | Admit: 2019-12-02 | Discharge: 2019-12-02 | Disposition: A | Discharge status: Home / Self Care | Payer: Managed Care, Other (non HMO) | Attending: Emergency Medicine | Admitting: Emergency Medicine

## 2019-12-02 DIAGNOSIS — H5789 Other specified disorders of eye and adnexa: Secondary | ICD-10-CM

## 2019-12-02 DIAGNOSIS — S0501XA Injury of conjunctiva and corneal abrasion without foreign body, right eye, initial encounter: Secondary | ICD-10-CM

## 2019-12-02 MED ORDER — HYPROMELLOSE 0.3 % OP GEL: Freq: Three times a day (TID) | OPHTHALMIC | 0 refills | Status: DC | PRN

## 2019-12-02 MED ORDER — OFLOXACIN 0.3 % OP SOLN: OPHTHALMIC | 0 refills | Status: DC

## 2019-12-02 NOTE — Discharge Instructions (Addendum)
Throw out old contacts and resume wearing contacts after treatment is completed Use ofloxacin as prescribed and to completion Genteal eye drops prescribed use as needed for symptomatic relief Use OTC ibuprofen or tylenol as needed for pain relief Follow up with ophthalmology or go to the ED if symptoms persists or worsen such as fever, chills, redness, swelling, eye pain, painful eye movements, vision changes, etc..Marland Kitchen

## 2019-12-02 NOTE — Progress Notes (Deleted)
GYNECOLOGY  VISIT   HPI: 44 y.o.   Single  Caucasian  female   G3P0003 with No LMP recorded. (Menstrual status: Irregular Periods).   here for 3 month follow up.   GYNECOLOGIC HISTORY: No LMP recorded. (Menstrual status: Irregular Periods). Contraception:  ***Abstinence/Pt is on [name] Menopausal hormone therapy:  *** Last mammogram:  07-15-19 Diag.Bil.& Lt.Br.US--3D/benign cysts Lt.br.decreased in size since 2017/Neg/density D/screening 25yr/BiRads2 Last pap smear: 08-02-19 ASCUS:Pos HR HPV; colpo bx showing atypia        OB History    Gravida  3   Para  3   Term  0   Preterm  0   AB  0   Living  3     SAB  0   TAB  0   Ectopic  0   Multiple  0   Live Births  0              Patient Active Problem List   Diagnosis Date Noted  . Depression with anxiety 08/15/2012    Past Medical History:  Diagnosis Date  . Allergy   . Anxiety   . Depression   . Dyslipidemia (high LDL; low HDL)     Past Surgical History:  Procedure Laterality Date  . BREAST SURGERY     cyst removal from L breast  . CESAREAN SECTION     x3  . CHOLECYSTECTOMY      Current Outpatient Medications  Medication Sig Dispense Refill  . clonazePAM (KLONOPIN) 0.5 MG tablet Take 1 tablet by mouth 3 (three) times daily.    Marland Kitchen escitalopram (LEXAPRO) 20 MG tablet Take 1 tablet by mouth daily.    Marland Kitchen ibuprofen (ADVIL,MOTRIN) 800 MG tablet Take 1 tablet (800 mg total) by mouth every 8 (eight) hours as needed for mild pain. 30 tablet 0  . norethindrone ([name]) 5 MG tablet Take 1 tablet (5 mg total) by mouth 3 (three) times daily. 90 tablet 0   No current facility-administered medications for this visit.     ALLERGIES: Codeine and Penicillins  Family History  Problem Relation Age of Onset  . Cancer Mother   . Cancer Father   . Breast cancer Sister        half sister     Social History   Socioeconomic History  . Marital status: Single    Spouse name: Not on file  . Number of  children: Not on file  . Years of education: Not on file  . Highest education level: Not on file  Occupational History  . Not on file  Tobacco Use  . Smoking status: Never Smoker  . Smokeless tobacco: Never Used  Substance and Sexual Activity  . Alcohol use: No    Alcohol/week: 0.0 standard drinks  . Drug use: No  . Sexual activity: Yes    Birth control/protection: Abstinence  Other Topics Concern  . Not on file  Social History Narrative   Education: Other. Married. Exercise: 7 days a week   Social Determinants of Health   Financial Resource Strain:   . Difficulty of Paying Living Expenses: Not on file  Food Insecurity:   . Worried About Charity fundraiser in the Last Year: Not on file  . Ran Out of Food in the Last Year: Not on file  Transportation Needs:   . Lack of Transportation (Medical): Not on file  . Lack of Transportation (Non-Medical): Not on file  Physical Activity:   . Days of Exercise per Week:  Not on file  . Minutes of Exercise per Session: Not on file  Stress:   . Feeling of Stress : Not on file  Social Connections:   . Frequency of Communication with Friends and Family: Not on file  . Frequency of Social Gatherings with Friends and Family: Not on file  . Attends Religious Services: Not on file  . Active Member of Clubs or Organizations: Not on file  . Attends Archivist Meetings: Not on file  . Marital Status: Not on file  Intimate Partner Violence:   . Fear of Current or Ex-Partner: Not on file  . Emotionally Abused: Not on file  . Physically Abused: Not on file  . Sexually Abused: Not on file    Review of Systems  PHYSICAL EXAMINATION:    There were no vitals taken for this visit.    General appearance: alert, cooperative and appears stated age Head: Normocephalic, without obvious abnormality, atraumatic Neck: no adenopathy, supple, symmetrical, trachea midline and thyroid normal to inspection and palpation Lungs: clear to  auscultation bilaterally Breasts: normal appearance, no masses or tenderness, No nipple retraction or dimpling, No nipple discharge or bleeding, No axillary or supraclavicular adenopathy Heart: regular rate and rhythm Abdomen: soft, non-tender, no masses,  no organomegaly Extremities: extremities normal, atraumatic, no cyanosis or edema Skin: Skin color, texture, turgor normal. No rashes or lesions Lymph nodes: Cervical, supraclavicular, and axillary nodes normal. No abnormal inguinal nodes palpated Neurologic: Grossly normal  Pelvic: External genitalia:  no lesions              Urethra:  normal appearing urethra with no masses, tenderness or lesions              Bartholins and Skenes: normal                 Vagina: normal appearing vagina with normal color and discharge, no lesions              Cervix: no lesions                Bimanual Exam:  Uterus:  normal size, contour, position, consistency, mobility, non-tender              Adnexa: no mass, fullness, tenderness              Rectal exam: {yes no:314532}.  Confirms.              Anus:  normal sphincter tone, no lesions  Chaperone was present for exam.  ASSESSMENT     PLAN     An After Visit Summary was printed and given to the patient.  ______ minutes face to face time of which over 50% was spent in counseling.

## 2019-12-02 NOTE — ED Provider Notes (Signed)
Julian   [id number] 12/02/19 Arrival Time: [id number]  CC: Red eye and pain  SUBJECTIVE:  Diane May is a 44 y.o. female who presents with complaint of RT eye redness and pain that began abruptly this morning.  Symptoms began after sleeping in contacts.  Has tried rinsing eye with contact lens solution without relief.  Symptoms are made worse with light and movement of eye.  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  Complains of blurred vision in RT eye, but this could be secondary to not having contact lens in, FB sensation, and watery discharge. Denies fever, chills, nausea, vomiting, halos,  itching, double vision, periorbital erythema.     ROS: As per HPI.  All other pertinent ROS negative.     Past Medical History:  Diagnosis Date  . Allergy   . Anxiety   . Depression   . Dyslipidemia (high LDL; low HDL)    Past Surgical History:  Procedure Laterality Date  . BREAST SURGERY     cyst removal from L breast  . CESAREAN SECTION     x3  . CHOLECYSTECTOMY     Allergies  Allergen Reactions  . Codeine Nausea Only  . Penicillins Rash   No current facility-administered medications on file prior to encounter.   Current Outpatient Medications on File Prior to Encounter  Medication Sig Dispense Refill  . clonazePAM (KLONOPIN) 0.5 MG tablet Take 1 tablet by mouth 3 (three) times daily.    Marland Kitchen escitalopram (LEXAPRO) 20 MG tablet Take 1 tablet by mouth daily.    Marland Kitchen ibuprofen (ADVIL,MOTRIN) 800 MG tablet Take 1 tablet (800 mg total) by mouth every 8 (eight) hours as needed for mild pain. 30 tablet 0  . norethindrone ([name]) 5 MG tablet Take 1 tablet (5 mg total) by mouth 3 (three) times daily. 90 tablet 0   Social History   Socioeconomic History  . Marital status: Single    Spouse name: Not on file  . Number of children: Not on file  . Years of education: Not on file  . Highest education level: Not on file  Occupational History  . Not on file  Tobacco Use  . Smoking  status: Never Smoker  . Smokeless tobacco: Never Used  Substance and Sexual Activity  . Alcohol use: No    Alcohol/week: 0.0 standard drinks  . Drug use: No  . Sexual activity: Yes    Birth control/protection: Abstinence  Other Topics Concern  . Not on file  Social History Narrative   Education: Other. Married. Exercise: 7 days a week   Social Determinants of Health   Financial Resource Strain:   . Difficulty of Paying Living Expenses: Not on file  Food Insecurity:   . Worried About Charity fundraiser in the Last Year: Not on file  . Ran Out of Food in the Last Year: Not on file  Transportation Needs:   . Lack of Transportation (Medical): Not on file  . Lack of Transportation (Non-Medical): Not on file  Physical Activity:   . Days of Exercise per Week: Not on file  . Minutes of Exercise per Session: Not on file  Stress:   . Feeling of Stress : Not on file  Social Connections:   . Frequency of Communication with Friends and Family: Not on file  . Frequency of Social Gatherings with Friends and Family: Not on file  . Attends Religious Services: Not on file  . Active Member of Clubs or Organizations: Not on  file  . Attends Archivist Meetings: Not on file  . Marital Status: Not on file  Intimate Partner Violence:   . Fear of Current or Ex-Partner: Not on file  . Emotionally Abused: Not on file  . Physically Abused: Not on file  . Sexually Abused: Not on file   Family History  Problem Relation Age of Onset  . Cancer Mother   . Cancer Father   . Breast cancer Sister        half sister     OBJECTIVE:    Visual Acuity  Right Eye Distance:  20/100 (w/o contact) Left Eye Distance:  20/16 (w/ contact) Bilateral Distance:  20/16 (w/ contact present LT eye)  Vitals:   12/02/19 1137  BP: 138/84  Pulse: 87  Resp: 17  Temp: 98.4 F (36.9 C)  TempSrc: Oral  SpO2: 97%    General appearance: alert; no distress Eyes: Bilateral eyes with mild-moderate  conjunctival erythema, no obvious periorbital erythema or swelling, no eyelid erythema or swelling. PERRL; EOMI without discomfort;  no obvious drainage; fluorescein uptake in 7 o'clock position of RT cornea HENT: NCAT; EACs clear, TMs pearly gray; nares patent; oropharynx clear Neck: supple Lungs: clear to auscultation bilaterally Heart: regular rate and rhythm Skin: warm and dry Psychological: alert and cooperative; normal mood and affect   ASSESSMENT & PLAN:  1. Redness of right eye   2. Abrasion of right cornea, initial encounter     Meds ordered this encounter  Medications  . ofloxacin (OCUFLOX) 0.3 % ophthalmic solution    Sig: Instill 1 or 2 drops in affected eye(s) every 2 to 4 hours for 2 days, then 1 to 2 drops 4 times daily on days 3 through 7    Dispense:  10 mL    Refill:  0    Order Specific Question:   Supervising Provider    Answer:   Raylene Everts [id number]  . hypromellose (GENTEAL) 0.3 % GEL ophthalmic ointment    Sig: Place into both eyes 3 (three) times daily as needed for dry eyes.    Dispense:  10 g    Refill:  0    Order Specific Question:   Supervising Provider    Answer:   Raylene Everts [id number]   Throw out old contacts and resume wearing contacts after treatment is completed Use ofloxacin as prescribed and to completion Genteal eye drops prescribed use as needed for symptomatic relief Use OTC ibuprofen or tylenol as needed for pain relief Follow up with ophthalmology or go to the ED if symptoms persists or worsen such as fever, chills, redness, swelling, eye pain, painful eye movements, vision changes, etc...  Reviewed expectations re: course of current medical issues. Questions answered. Outlined signs and symptoms indicating need for more acute intervention. Patient verbalized understanding. After Visit Summary given.   Lestine Box, PA-C 12/02/19 1231

## 2019-12-02 NOTE — ED Triage Notes (Signed)
Pt has slept in contacts past couple of nights, right eye red and irritated

## 2019-12-08 ENCOUNTER — Other Ambulatory Visit: Payer: Self-pay

## 2019-12-09 ENCOUNTER — Other Ambulatory Visit (HOSPITAL_COMMUNITY)
Admission: RE | Admit: 2019-12-09 | Discharge: 2019-12-09 | Disposition: A | Discharge status: Home / Self Care | Payer: Managed Care, Other (non HMO) | Source: Ambulatory Visit | Attending: Obstetrics and Gynecology | Admitting: Obstetrics and Gynecology

## 2019-12-09 ENCOUNTER — Ambulatory Visit (INDEPENDENT_AMBULATORY_CARE_PROVIDER_SITE_OTHER): Payer: Managed Care, Other (non HMO) | Admitting: Obstetrics and Gynecology

## 2019-12-09 ENCOUNTER — Encounter: Payer: Self-pay | Admitting: Obstetrics and Gynecology

## 2019-12-09 VITALS — BP 110/70 | HR 88 | Temp 97.2°F | Ht 63.0 in | Wt 166.6 lb

## 2019-12-09 DIAGNOSIS — R319 Hematuria, unspecified: Secondary | ICD-10-CM

## 2019-12-09 DIAGNOSIS — Z113 Encounter for screening for infections with a predominantly sexual mode of transmission: Secondary | ICD-10-CM | POA: Insufficient documentation

## 2019-12-09 DIAGNOSIS — B977 Papillomavirus as the cause of diseases classified elsewhere: Secondary | ICD-10-CM

## 2019-12-09 DIAGNOSIS — N939 Abnormal uterine and vaginal bleeding, unspecified: Secondary | ICD-10-CM

## 2019-12-09 DIAGNOSIS — Z0189 Encounter for other specified special examinations: Secondary | ICD-10-CM | POA: Diagnosis not present

## 2019-12-09 DIAGNOSIS — Z5181 Encounter for therapeutic drug level monitoring: Secondary | ICD-10-CM

## 2019-12-09 DIAGNOSIS — N879 Dysplasia of cervix uteri, unspecified: Secondary | ICD-10-CM

## 2019-12-09 LAB — POCT URINALYSIS DIPSTICK
Bilirubin, UA: NEGATIVE
Glucose, UA: NEGATIVE
Ketones, UA: NEGATIVE
Leukocytes, UA: NEGATIVE
Nitrite, UA: NEGATIVE
Protein, UA: POSITIVE — AB
Urobilinogen, UA: 0.2 E.U./dL
pH, UA: 5 (ref 5.0–8.0)

## 2019-12-09 MED ORDER — NORETHINDRONE ACETATE 5 MG PO TABS: ORAL_TABLET | ORAL | 0 refills | Status: DC

## 2019-12-09 NOTE — Progress Notes (Signed)
GYNECOLOGY  VISIT   HPI: 43 y.o.   Married  Caucasian  female   G3P0003 with No LMP recorded. (Menstrual status: Irregular Periods).   here for 3 month medication follow up.  Patient is taking [name] 5 mg po tid for abnormal uterine bleeding.  She had a pelvic US on 07/15/19, and was noted to have a 25 mm fibroid located anterior and to the left.  Her IUD was expelling at the time of her [place], so it was removed.  She had a sonohysterogram and no filling defects were seen.  Her EMB showed inactive endometrium and progesterone changes.  She stopped the Agyestin for one week, and she had a period for one week.  She had heavy flow and she had to change a pad every 1.5 - 2 hours for 4 days.  She had thick clots. She did this 1 - 2 weeks ago.  She restarted the [name] again and the bleeding stopped.  She likes the [name] because it controls the bleeding.  She has black spotting occasionally with wiping.  She states that this occurs if she holds her urine.  Occurs sporadically.   She notes increase appetite.  Denies bloating or depression.   She had hair loss with Depo Provera.   Not sexually active since she had her pap smear.  She is worried about HPV.  Her AMH level is < 0.015 on 09/02/19.  Patient wants urine checked for glucose. Her identical sister just developed diabetes.   Urine Dip: 3+ RBC, trace protein  GYNECOLOGIC HISTORY: No LMP recorded. (Menstrual status: Irregular Periods). Contraception: Abstinence/pt.is on [name] Menopausal hormone therapy:  n/a Last mammogram:  07-15-19 Diag.Bil.& Lt.Br.US--3D/benign cysts Lt.br.decreased in size since 2017/Neg/density D/screening 38yr/BiRads2 Last pap smear: 08-02-19 ASCUS:Pos HR HPV; colpo bx showing atypia        OB History    Gravida  3   Para  3   Term  0   Preterm  0   AB  0   Living  3     SAB  0   TAB  0   Ectopic  0   Multiple  0   Live Births  0              Patient Active Problem List    Diagnosis Date Noted  . Depression with anxiety 08/15/2012    Past Medical History:  Diagnosis Date  . Allergy   . Anxiety   . Depression   . Dyslipidemia (high LDL; low HDL)     Past Surgical History:  Procedure Laterality Date  . BREAST SURGERY     cyst removal from L breast  . CESAREAN SECTION     x3  . CHOLECYSTECTOMY      Current Outpatient Medications  Medication Sig Dispense Refill  . citalopram (CELEXA) 10 MG tablet Take 10 mg by mouth daily.    . clonazePAM (KLONOPIN) 0.5 MG tablet Take 1 tablet by mouth 3 (three) times daily.    . hypromellose (GENTEAL) 0.3 % GEL ophthalmic ointment Place into both eyes 3 (three) times daily as needed for dry eyes. 10 g 0  . ibuprofen (ADVIL,MOTRIN) 800 MG tablet Take 1 tablet (800 mg total) by mouth every 8 (eight) hours as needed for mild pain. 30 tablet 0  . norethindrone ([name]) 5 MG tablet Take 1 tablet (5 mg total) by mouth 3 (three) times daily. 90 tablet 0  . ofloxacin (OCUFLOX) 0.3 % ophthalmic solution Instill 1 or  2 drops in affected eye(s) every 2 to 4 hours for 2 days, then 1 to 2 drops 4 times daily on days 3 through 7 10 mL 0   No current facility-administered medications for this visit.     ALLERGIES: Codeine and Penicillins  Family History  Problem Relation Age of Onset  . Cancer Mother   . Cancer Father   . Breast cancer Sister        half sister     Social History   Socioeconomic History  . Marital status: Single    Spouse name: Not on file  . Number of children: Not on file  . Years of education: Not on file  . Highest education level: Not on file  Occupational History  . Not on file  Tobacco Use  . Smoking status: Never Smoker  . Smokeless tobacco: Never Used  Substance and Sexual Activity  . Alcohol use: No    Alcohol/week: 0.0 standard drinks  . Drug use: No  . Sexual activity: Yes    Birth control/protection: Abstinence  Other Topics Concern  . Not on file  Social History  Narrative   Education: Other. Married. Exercise: 7 days a week   Social Determinants of Health   Financial Resource Strain:   . Difficulty of Paying Living Expenses: Not on file  Food Insecurity:   . Worried About Charity fundraiser in the Last Year: Not on file  . Ran Out of Food in the Last Year: Not on file  Transportation Needs:   . Lack of Transportation (Medical): Not on file  . Lack of Transportation (Non-Medical): Not on file  Physical Activity:   . Days of Exercise per Week: Not on file  . Minutes of Exercise per Session: Not on file  Stress:   . Feeling of Stress : Not on file  Social Connections:   . Frequency of Communication with Friends and Family: Not on file  . Frequency of Social Gatherings with Friends and Family: Not on file  . Attends Religious Services: Not on file  . Active Member of Clubs or Organizations: Not on file  . Attends Archivist Meetings: Not on file  . Marital Status: Not on file  Intimate Partner Violence:   . Fear of Current or Ex-Partner: Not on file  . Emotionally Abused: Not on file  . Physically Abused: Not on file  . Sexually Abused: Not on file    Review of Systems  PHYSICAL EXAMINATION:    BP 110/70   Pulse 88   Temp (!) 97.2 F (36.2 C) (Temporal)   Ht 5\' 3"  (1.6 m)   Wt 166 lb 9.6 oz (75.6 kg)   BMI 29.51 kg/m     General appearance: alert, cooperative and appears stated age    Pelvic: External genitalia:  no lesions              Urethra:  normal appearing urethra with no masses, tenderness or lesions              Bartholins and Skenes: normal                 Vagina: normal appearing vagina with normal color and discharge, no lesions              Cervix: no lesions.  Dark blood noted in the vagina.                 Bimanual Exam:  Uterus:  normal size, contour, position, consistency, mobility, non-tender              Adnexa: no mass, fullness, tenderness            Chaperone was present for  exam.  ASSESSMENT  Abnormal uterine bleeding.   Medication monitoring.  Currently on [name] 5 mg po tid with breakthrough bleeding.   Positive HR HPV.  Cervical atypia.  Abnormal urine dip - likely from vaginal bleeding contamination to the urine specimen.  No glucosuria.   PLAN  GC/CT/trichomonas testing today.  She will reduce the [name] 5 mg bid for 3 months.  We will then try to reduce to 5 mg daily.  She will return for a recheck in the office in 3 months.  We discussed HPV, route of transmission, potential for spontaneous resolution, and need for follow up.  Her next cervical cancer screening is due in the fall, 2021.  We will send her urine for micro and culture.    An After Visit Summary was printed and given to the patient.  ___25___ minutes face to face time of which over 50% was spent in counseling.

## 2019-12-10 LAB — URINALYSIS, MICROSCOPIC ONLY
Bacteria, UA: NONE SEEN
Casts: NONE SEEN /lpf

## 2019-12-11 LAB — URINE CULTURE: Organism ID, Bacteria: NO GROWTH

## 2019-12-13 LAB — CERVICOVAGINAL ANCILLARY ONLY
Chlamydia: NEGATIVE
Comment: NEGATIVE
Comment: NEGATIVE
Comment: NORMAL
Neisseria Gonorrhea: NEGATIVE
Trichomonas: NEGATIVE

## 2020-02-28 ENCOUNTER — Telehealth: Payer: Self-pay | Admitting: Obstetrics and Gynecology

## 2020-02-28 NOTE — Telephone Encounter (Signed)
Patient has been bleeding and cramping on her estrogen medication.

## 2020-02-28 NOTE — Telephone Encounter (Signed)
AEX 07/2019 AUB OV 12/09/19, was placed on [name] 5mg  BID for 3 months, then 5mg  daily to start on 03/07/20. Hx ACUS pap with +HPV 08/2019. Had Colpo= atypia  Has 3 month f/u scheduled 03/09/2020.   Spoke with pt. Pt reports started spotting with intermittent bleeding "gushes" since 02/20/20. Pt states still taking [name] 5mg  BID. Pt states having slight cramps and only taking Aleve, which helps.  Pt states changing pads every 3 hours. Pt denies weakness, lightheaded, or dizziness.  Advised pt OV for further evaluation. Pt agreeable. Pt scheduled for 03/02/20 at 1pm per pt's request of date and time. Can only do Thursdays for appt.  ER precautions given. Pt verbalized understanding.   Routing to Dr Quincy Simmonds for review.  Encounter closed.

## 2020-03-01 ENCOUNTER — Other Ambulatory Visit: Payer: Self-pay

## 2020-03-01 NOTE — Progress Notes (Signed)
GYNECOLOGY  VISIT   HPI: 44 y.o.   Single  Caucasian  female   [id number] with Patient's last menstrual period was 02/13/2020 (approximate).   here for abnormal uterine bleeding on [name]. She is taking [name] 5 mg po bid.   She has been on as much as 5 mg tid and continued to have dark spotting.   Patient having episodes of vaginal bleeding since 02-13-20.   She has tried [place] IUD, [place], [name], and continues to bleed.  She actually had a malpositioned Mirena which was in her LUS, so I removed it. She has a 25 mm fibroid intramural and negative EMB.  She has some fatigue.   Is a dog groomer.   UPT -  Neg  GYNECOLOGIC HISTORY: Patient's last menstrual period was 02/13/2020 (approximate). Contraception:  AbstinencePt.is on [name] Menopausal hormone therapy: [name] 5mg  bid Last mammogram: 07-15-19 Diag.Bil.& Lt.Br.US--3D/benign cysts Lt.br.decreased in size since 2017/Neg/density D/screening 37yr/BiRads2 Last pap smear: 08-02-19 ASCUS:Pos HR HPV; colpo bx showing atypia        OB History    Gravida  3   Para  3   Term  0   Preterm  0   AB  0   Living  3     SAB  0   TAB  0   Ectopic  0   Multiple  0   Live Births  0              Patient Active Problem List   Diagnosis Date Noted  . Depression with anxiety 08/15/2012    Past Medical History:  Diagnosis Date  . Allergy   . Anxiety   . Depression   . Dyslipidemia (high LDL; low HDL)     Past Surgical History:  Procedure Laterality Date  . BREAST SURGERY     cyst removal from L breast  . CESAREAN SECTION     x3  . CHOLECYSTECTOMY      Current Outpatient Medications  Medication Sig Dispense Refill  . citalopram (CELEXA) 10 MG tablet Take 10 mg by mouth daily.    . clonazePAM (KLONOPIN) 0.5 MG tablet Take 1 tablet by mouth 3 (three) times daily.    . hypromellose (GENTEAL) 0.3 % GEL ophthalmic ointment Place into both eyes 3 (three) times daily as needed for dry eyes. 10 g 0  .  ibuprofen (ADVIL,MOTRIN) 800 MG tablet Take 1 tablet (800 mg total) by mouth every 8 (eight) hours as needed for mild pain. 30 tablet 0  . norethindrone ([name]) 5 MG tablet Take one tablet (5 mg) by mouth twice a day. 180 tablet 0  . ofloxacin (OCUFLOX) 0.3 % ophthalmic solution Instill 1 or 2 drops in affected eye(s) every 2 to 4 hours for 2 days, then 1 to 2 drops 4 times daily on days 3 through 7 10 mL 0   No current facility-administered medications for this visit.     ALLERGIES: Codeine and Penicillins  Family History  Problem Relation Age of Onset  . Cancer Mother   . Cancer Father   . Breast cancer Sister        half sister     Social History   Socioeconomic History  . Marital status: Single    Spouse name: Not on file  . Number of children: Not on file  . Years of education: Not on file  . Highest education level: Not on file  Occupational History  . Not on file  Tobacco Use  .  Smoking status: Never Smoker  . Smokeless tobacco: Never Used  Substance and Sexual Activity  . Alcohol use: No    Alcohol/week: 0.0 standard drinks  . Drug use: No  . Sexual activity: Yes    Birth control/protection: Abstinence  Other Topics Concern  . Not on file  Social History Narrative   Education: Other. Married. Exercise: 7 days a week   Social Determinants of Health   Financial Resource Strain:   . Difficulty of Paying Living Expenses:   Food Insecurity:   . Worried About Charity fundraiser in the Last Year:   . Arboriculturist in the Last Year:   Transportation Needs:   . Film/video editor (Medical):   Marland Kitchen Lack of Transportation (Non-Medical):   Physical Activity:   . Days of Exercise per Week:   . Minutes of Exercise per Session:   Stress:   . Feeling of Stress :   Social Connections:   . Frequency of Communication with Friends and Family:   . Frequency of Social Gatherings with Friends and Family:   . Attends Religious Services:   . Active Member of Clubs or  Organizations:   . Attends Archivist Meetings:   Marland Kitchen Marital Status:   Intimate Partner Violence:   . Fear of Current or Ex-Partner:   . Emotionally Abused:   Marland Kitchen Physically Abused:   . Sexually Abused:     Review of Systems  All other systems reviewed and are negative.   PHYSICAL EXAMINATION:    BP 122/70   Pulse 80   Temp (!) 97.5 F (36.4 C) (Temporal)   Ht 5\' 3"  (1.6 m)   Wt 161 lb 9.6 oz (73.3 kg)   LMP 02/13/2020 (Approximate)   BMI 28.63 kg/m     General appearance: alert, cooperative and appears stated age  Pelvic: External genitalia:  no lesions              Urethra:  normal appearing urethra with no masses, tenderness or lesions              Bartholins and Skenes: normal                 Vagina: normal appearing vagina with normal color and discharge, no lesions              Cervix: no lesions.  Menstrual flow.                 Bimanual Exam:  Uterus:  normal size, contour, position, consistency, mobility, non-tender              Adnexa: no mass, fullness, tenderness          Chaperone was present for exam.  ASSESSMENT  Menorrhagia with irregular menses.  Failed [name].    PLAN  She wants to do a hysteroscopy, dilation and curettage.  ACOG HOs.  Will proceed with precert and scheduling. She declined repeat Mirena.  She declines hysterectomy.  Check CBC and UPT now.    An After Visit Summary was printed and given to the patient.  __25____ minutes face to face time of which over 50% was spent in counseling.

## 2020-03-02 ENCOUNTER — Ambulatory Visit: Payer: 59 | Admitting: Obstetrics and Gynecology

## 2020-03-02 ENCOUNTER — Telehealth: Payer: Self-pay | Admitting: Obstetrics and Gynecology

## 2020-03-02 ENCOUNTER — Encounter: Payer: Self-pay | Admitting: Obstetrics and Gynecology

## 2020-03-02 VITALS — BP 122/70 | HR 80 | Temp 97.5°F | Ht 63.0 in | Wt 161.6 lb

## 2020-03-02 DIAGNOSIS — D219 Benign neoplasm of connective and other soft tissue, unspecified: Secondary | ICD-10-CM | POA: Diagnosis not present

## 2020-03-02 DIAGNOSIS — N921 Excessive and frequent menstruation with irregular cycle: Secondary | ICD-10-CM | POA: Diagnosis not present

## 2020-03-02 LAB — POCT URINE PREGNANCY: Preg Test, Ur: NEGATIVE

## 2020-03-02 NOTE — Telephone Encounter (Signed)
Please precert and schedule hysteroscopy with dilation and curettage for menorrhagia with irregular menstruation.   She is currently on [name] and continues to have abnormal bleeding.

## 2020-03-03 LAB — CBC
Hematocrit: 47.6 % — ABNORMAL HIGH (ref 34.0–46.6)
Hemoglobin: 16.3 g/dL — ABNORMAL HIGH (ref 11.1–15.9)
MCH: 30.8 pg (ref 26.6–33.0)
MCHC: 34.2 g/dL (ref 31.5–35.7)
MCV: 90 fL (ref 79–97)
Platelets: 336 10*3/uL (ref 150–450)
RBC: 5.29 x10E6/uL — ABNORMAL HIGH (ref 3.77–5.28)
RDW: 12.6 % (ref 11.7–15.4)
WBC: 7.5 10*3/uL (ref 3.4–10.8)

## 2020-03-05 ENCOUNTER — Other Ambulatory Visit: Payer: Self-pay | Admitting: Obstetrics and Gynecology

## 2020-03-06 NOTE — Telephone Encounter (Signed)
Medication refill request: aygestin 5mg  Last AEX:  08-02-2019 Next AEX: not schedule Last OV: 03-02-2020 Next OV: 03-09-2020 Last MMG (if hormonal medication request): 07/2019 bilateral & left breast u/s birads 2:neg Refill authorized: please approve if appropriate

## 2020-03-07 NOTE — Telephone Encounter (Signed)
Spoke with patient regarding updated insurance information. Patient stated that insurance card was at home and would return call with new information.

## 2020-03-09 ENCOUNTER — Ambulatory Visit: Payer: Managed Care, Other (non HMO) | Admitting: Obstetrics and Gynecology

## 2020-03-22 ENCOUNTER — Telehealth: Payer: Self-pay | Admitting: Obstetrics and Gynecology

## 2020-03-22 NOTE — Telephone Encounter (Signed)
OV 03/02/20- AUB, IUD removed H/o menorrhagia  H/o 25 mm fibroid per PUS on 07/2019  Spoke with pt. Pt reports has stopped bleeding now x 7 days since 03/16/20. Pt states only had very light spotting after stopping [name].   Pt states stopped using [name] on 03/02/20. Denies having heavy bleeding, clots or vaginal sx or changing a panty liner regularly with spotting.   Pt states was recommended to have hysteroscopy with D&C by Dr Quincy Simmonds for AUB.  Pt requesting  recommendations per Dr Quincy Simmonds in next steps in plan of care. Pt declines  IUD or hysterectomy at this time. Pt also states doesn't want D&C if not necessary so she doesn't miss work while out after procedure.  Advised pt will review and discuss recommendations with Dr Quincy Simmonds and return call to pt. Pt agreeable.   Routing to Dr Quincy Simmonds   Per OV notes 03/02/20: PLAN She wants to do a hysteroscopy, dilation and curettage.  ACOG HOs.  Will proceed with precert and scheduling. She declined repeat Mirena.  She declines hysterectomy.

## 2020-03-22 NOTE — Telephone Encounter (Signed)
Patient says she has stopped bleeding and want to know what is the next step since she was going to have a D&C.

## 2020-03-23 NOTE — Telephone Encounter (Signed)
Please see telephone encounter dated 03/22/2020. Encounter closed.

## 2020-03-23 NOTE — Telephone Encounter (Signed)
Ok to not proceed with any surgery at this time.  If she starts having heavy bleeding again, she needs to get in touch with me.  I recommend she does not self treat with [name] if this occurs.  Cc- Kaitlyn Sprague

## 2020-03-23 NOTE — Telephone Encounter (Signed)
Left a detailed message, okay per ROI for the patient advising of message as seen below from [place]. Advised to return call with any additional questions or concerns.  Cc: Evern Core and Hayley Carder  Routing to provider and will close encounter.

## 2020-03-23 NOTE — Telephone Encounter (Signed)
Spoke with pt. Pt given update and recommendations per Dr Quincy Simmonds. Pt agreeable and verbalized understanding. Pt will continue to monitor and if heavy bleeding starts, then will call office. Pt understands not to take [name]. Pt states still no bleeding today.   Routing to Dr Quincy Simmonds for review.  Encounter closed.  Cc: Kaitlyn for update

## 2020-08-08 ENCOUNTER — Other Ambulatory Visit: Payer: Self-pay | Admitting: Obstetrics and Gynecology

## 2021-01-03 ENCOUNTER — Ambulatory Visit
Admission: EM | Admit: 2021-01-03 | Discharge: 2021-01-03 | Disposition: A | Discharge status: Home / Self Care | Payer: 59 | Attending: Emergency Medicine | Admitting: Emergency Medicine

## 2021-01-03 ENCOUNTER — Encounter: Payer: Self-pay | Admitting: Emergency Medicine

## 2021-01-03 ENCOUNTER — Other Ambulatory Visit: Payer: Self-pay

## 2021-01-03 DIAGNOSIS — J208 Acute bronchitis due to other specified organisms: Secondary | ICD-10-CM

## 2021-01-03 DIAGNOSIS — J029 Acute pharyngitis, unspecified: Secondary | ICD-10-CM | POA: Diagnosis not present

## 2021-01-03 MED ORDER — AZITHROMYCIN 250 MG PO TABS: 250.0000 mg | ORAL_TABLET | Freq: Every day | ORAL | 0 refills | Status: DC

## 2021-01-03 MED ORDER — BENZONATATE 100 MG PO CAPS: 100.0000 mg | ORAL_CAPSULE | Freq: Three times a day (TID) | ORAL | 0 refills | Status: DC | PRN

## 2021-01-03 MED ORDER — PREDNISONE 10 MG PO TABS: 20.0000 mg | ORAL_TABLET | Freq: Every day | ORAL | 0 refills | Status: DC

## 2021-01-03 NOTE — ED Triage Notes (Signed)
Cough x 3 weeks, sore throat.  Has taken multiple covid test that have been negative.  Productive cough with yellowish green sputum

## 2021-01-03 NOTE — ED Provider Notes (Signed)
[facility]   [id number] 01/03/21 Arrival Time: 1822   CC: URI symptoms  SUBJECTIVE: History from: patient.  Diane May is a 45 y.o. female who presents to the urgent care with a complaint of cough with yellowish-green sputum and sore throat for the past 3 weeks.  Denies sick exposure to COVID, flu or strep.  Denies recent travel.  Has tried OTC medication without relief.  Denies alleviating or aggravating factors.  Denies previous symptoms in the past.   Denies fever, chills, fatigue, sinus pain, rhinorrhea, sore throat, SOB, wheezing, chest pain, nausea, changes in bowel or bladder habits.     ROS: As per HPI.  All other pertinent ROS negative.     Past Medical History:  Diagnosis Date  . Allergy   . Anxiety   . Depression   . Dyslipidemia (high LDL; low HDL)    Past Surgical History:  Procedure Laterality Date  . BREAST SURGERY     cyst removal from L breast  . CESAREAN SECTION     x3  . CHOLECYSTECTOMY     Allergies  Allergen Reactions  . Codeine Nausea Only  . Penicillins Rash   No current facility-administered medications on file prior to encounter.   Current Outpatient Medications on File Prior to Encounter  Medication Sig Dispense Refill  . citalopram (CELEXA) 10 MG tablet Take 10 mg by mouth daily.    . clonazePAM (KLONOPIN) 0.5 MG tablet Take 1 tablet by mouth 3 (three) times daily.    . hypromellose (GENTEAL) 0.3 % GEL ophthalmic ointment Place into both eyes 3 (three) times daily as needed for dry eyes. 10 g 0  . ibuprofen (ADVIL,MOTRIN) 800 MG tablet Take 1 tablet (800 mg total) by mouth every 8 (eight) hours as needed for mild pain. 30 tablet 0  . norethindrone ([name]) 5 MG tablet TAKE 1 TABLET(5 MG) BY MOUTH TWICE DAILY 60 tablet 0  . ofloxacin (OCUFLOX) 0.3 % ophthalmic solution Instill 1 or 2 drops in affected eye(s) every 2 to 4 hours for 2 days, then 1 to 2 drops 4 times daily on days 3 through 7 10 mL 0   Social History    Socioeconomic History  . Marital status: Single    Spouse name: Not on file  . Number of children: Not on file  . Years of education: Not on file  . Highest education level: Not on file  Occupational History  . Not on file  Tobacco Use  . Smoking status: Never Smoker  . Smokeless tobacco: Never Used  Vaping Use  . Vaping Use: Never used  Substance and Sexual Activity  . Alcohol use: No    Alcohol/week: 0.0 standard drinks  . Drug use: No  . Sexual activity: Yes    Birth control/protection: Abstinence  Other Topics Concern  . Not on file  Social History Narrative   Education: Other. Married. Exercise: 7 days a week   Social Determinants of Health   Financial Resource Strain: Not on file  Food Insecurity: Not on file  Transportation Needs: Not on file  Physical Activity: Not on file  Stress: Not on file  Social Connections: Not on file  Intimate Partner Violence: Not on file   Family History  Problem Relation Age of Onset  . Cancer Mother   . Cancer Father   . Breast cancer Sister        half sister     OBJECTIVE:  Vitals:   01/03/21 1828  BP: 135/86  Pulse: 88  Resp: 18  Temp: 98.6 F (37 C)  TempSrc: Oral  SpO2: 95%     General appearance: alert; appears fatigued, but nontoxic; speaking in full sentences and tolerating own secretions HEENT: NCAT; Ears: EACs clear, TMs pearly gray; Eyes: PERRL.  EOM grossly intact. Sinuses: nontender; Nose: nares patent without rhinorrhea, Throat: oropharynx clear, tonsils non erythematous or enlarged, uvula midline  Neck: supple without LAD Lungs: unlabored respirations, symmetrical air entry; cough: moderate; no respiratory distress; CTAB Heart: regular rate and rhythm.  Radial pulses 2+ symmetrical bilaterally Skin: warm and dry Psychological: alert and cooperative; normal mood and affect  LABS:  No results found for this or any previous visit (from the past 24 hour(s)).   ASSESSMENT & PLAN:  1. Sore throat    2. Acute bronchitis due to other specified organisms     Meds ordered this encounter  Medications  . azithromycin (ZITHROMAX) 250 MG tablet    Sig: Take 1 tablet (250 mg total) by mouth daily. Take first 2 tablets together, then 1 every day until finished.    Dispense:  6 tablet    Refill:  0  . predniSONE (DELTASONE) 10 MG tablet    Sig: Take 2 tablets (20 mg total) by mouth daily.    Dispense:  15 tablet    Refill:  0  . benzonatate (TESSALON) 100 MG capsule    Sig: Take 1 capsule (100 mg total) by mouth 3 (three) times daily as needed for cough.    Dispense:  30 capsule    Refill:  0    Discharge instructions  Get plenty of rest and push fluids Tessalon Perles prescribed for cough Azithromycin was prescribed  Prednisone was prescribed/take as directed use medications daily for symptom relief Use OTC medications like ibuprofen or tylenol as needed fever or pain Call or go to the ED if you have any new or worsening symptoms such as fever, worsening cough, shortness of breath, chest tightness, chest pain, turning blue, changes in mental status, etc...   Reviewed expectations re: course of current medical issues. Questions answered. Outlined signs and symptoms indicating need for more acute intervention. Patient verbalized understanding. After Visit Summary given.         Diane May, [place] 01/03/21 1842

## 2021-01-03 NOTE — Discharge Instructions (Signed)
Get plenty of rest and push fluids Tessalon Perles prescribed for cough Azithromycin was prescribed  Prednisone was prescribed/take as directed use medications daily for symptom relief Use OTC medications like ibuprofen or tylenol as needed fever or pain Call or go to the ED if you have any new or worsening symptoms such as fever, worsening cough, shortness of breath, chest tightness, chest pain, turning blue, changes in mental status, etc..Marland Kitchen

## 2021-01-04 ENCOUNTER — Telehealth: Payer: Self-pay | Admitting: Emergency Medicine

## 2021-01-04 MED ORDER — ALBUTEROL SULFATE HFA 108 (90 BASE) MCG/ACT IN AERS: 2.0000 | INHALATION_SPRAY | RESPIRATORY_TRACT | 0 refills | Status: AC | PRN

## 2021-01-08 ENCOUNTER — Telehealth: Payer: Self-pay | Admitting: Emergency Medicine

## 2021-01-08 MED ORDER — PROMETHAZINE-DM 6.25-15 MG/5ML PO SYRP: 5.0000 mL | ORAL_SOLUTION | Freq: Four times a day (QID) | ORAL | 0 refills | Status: DC | PRN

## 2021-01-08 NOTE — Telephone Encounter (Signed)
Presentation was called in to pharmacy on file

## 2021-04-15 ENCOUNTER — Encounter: Payer: Self-pay | Admitting: Emergency Medicine

## 2021-04-15 ENCOUNTER — Other Ambulatory Visit: Payer: Self-pay

## 2021-04-15 ENCOUNTER — Ambulatory Visit
Admission: EM | Admit: 2021-04-15 | Discharge: 2021-04-15 | Disposition: A | Discharge status: Home / Self Care | Payer: 59 | Attending: Emergency Medicine | Admitting: Emergency Medicine

## 2021-04-15 DIAGNOSIS — M791 Myalgia, unspecified site: Secondary | ICD-10-CM

## 2021-04-15 DIAGNOSIS — R059 Cough, unspecified: Secondary | ICD-10-CM | POA: Diagnosis present

## 2021-04-15 DIAGNOSIS — Z20822 Contact with and (suspected) exposure to covid-19: Secondary | ICD-10-CM | POA: Diagnosis present

## 2021-04-15 LAB — POCT RAPID STREP A (OFFICE): Rapid Strep A Screen: NEGATIVE

## 2021-04-15 MED ORDER — TRIAMCINOLONE ACETONIDE 0.1 % EX CREA: 1.0000 "application " | TOPICAL_CREAM | Freq: Two times a day (BID) | CUTANEOUS | 0 refills | Status: DC

## 2021-04-15 MED ORDER — DOXYCYCLINE HYCLATE 100 MG PO CAPS: 100.0000 mg | ORAL_CAPSULE | Freq: Two times a day (BID) | ORAL | 0 refills | Status: AC

## 2021-04-15 MED ORDER — IBUPROFEN 600 MG PO TABS: 600.0000 mg | ORAL_TABLET | Freq: Four times a day (QID) | ORAL | 0 refills | Status: DC | PRN

## 2021-04-15 MED ORDER — BENZONATATE 200 MG PO CAPS: 200.0000 mg | ORAL_CAPSULE | Freq: Three times a day (TID) | ORAL | 0 refills | Status: DC | PRN

## 2021-04-15 MED ORDER — HYDROCOD POLST-CPM POLST ER 10-8 MG/5ML PO SUER: 5.0000 mL | Freq: Two times a day (BID) | ORAL | 0 refills | Status: DC | PRN

## 2021-04-15 NOTE — Discharge Instructions (Addendum)
Tessalon for the cough during the day, Tussionex for cough at night.  600 mg of ibuprofen combined with 1000 mg of Tylenol together 3-4 times a day as needed for body aches, headaches.  Finish the doxycycline unless your [place] spotted fever and Lyme titers come back negative.  Triamcinolone cream for the rash.

## 2021-04-15 NOTE — ED Provider Notes (Signed)
HPI  SUBJECTIVE:  Diane May is a 45 y.o. female who presents with 1 week of body aches, cough, sore throat, headaches and a pruritic rash on the left lower extremity.  No known tick bite.  No fevers, nasal congestion, rhinorrhea, postnasal drip, loss of sense of smell or taste.  She reports occasional wheezing at night and some shortness of breath present only with lying down.  No nausea, vomiting, diarrhea, abdominal pain.  She got the second COVID-vaccine in January 22 and she also got the flu vaccine.  No known COVID or flu exposure.  She has had 3 negative home COVID tests.  No allergy symptoms.  She states that her GERD is bothering her.  No antibiotics in the past month.  She took NyQuil within 6 hours of evaluation.  She has tried over-the-counter hydrocortisone for the rash, DayQuil, NyQuil, Tylenol for her other symptoms without improvement.  Her cough is worse with lying down.  States that she is unable to sleep at night secondary to the cough.  She has no past medical history.  LMP: 2 weeks ago.  Denies the possibility being pregnant.  PMD: [facility]    Past Medical History:  Diagnosis Date   Allergy    Anxiety    Depression    Dyslipidemia (high LDL; low HDL)     Past Surgical History:  Procedure Laterality Date   BREAST SURGERY     cyst removal from L breast   CESAREAN SECTION     x3   CHOLECYSTECTOMY      Family History  Problem Relation Age of Onset   Cancer Mother    Cancer Father    Breast cancer Sister        half sister     Social History   Tobacco Use   Smoking status: Never   Smokeless tobacco: Never  Vaping Use   Vaping Use: Never used  Substance Use Topics   Alcohol use: No    Alcohol/week: 0.0 standard drinks   Drug use: No    No current facility-administered medications for this encounter.  Current Outpatient Medications:    benzonatate (TESSALON) 200 MG capsule, Take 1 capsule (200 mg total) by mouth 3 (three) times  daily as needed for cough., Disp: 30 capsule, Rfl: 0   chlorpheniramine-HYDROcodone (TUSSIONEX PENNKINETIC ER) 10-8 MG/5ML SUER, Take 5 mLs by mouth every 12 (twelve) hours as needed for cough., Disp: 60 mL, Rfl: 0   doxycycline (VIBRAMYCIN) 100 MG capsule, Take 1 capsule (100 mg total) by mouth 2 (two) times daily for 10 days., Disp: 20 capsule, Rfl: 0   ibuprofen (ADVIL) 600 MG tablet, Take 1 tablet (600 mg total) by mouth every 6 (six) hours as needed., Disp: 30 tablet, Rfl: 0   triamcinolone cream (KENALOG) 0.1 %, Apply 1 application topically 2 (two) times daily. Apply for 2 weeks. May use on face, Disp: 30 g, Rfl: 0   albuterol (VENTOLIN HFA) 108 (90 Base) MCG/ACT inhaler, Inhale 2 puffs into the lungs every 4 (four) hours as needed for wheezing or shortness of breath., Disp: 18 g, Rfl: 0   citalopram (CELEXA) 10 MG tablet, Take 10 mg by mouth daily., Disp: , Rfl:    hypromellose (GENTEAL) 0.3 % GEL ophthalmic ointment, Place into both eyes 3 (three) times daily as needed for dry eyes., Disp: 10 g, Rfl: 0   norethindrone ([name]) 5 MG tablet, TAKE 1 TABLET(5 MG) BY MOUTH TWICE DAILY, Disp: 60 tablet, Rfl:  0  Allergies  Allergen Reactions   Penicillins Rash     ROS  As noted in HPI.   Physical Exam  BP 131/90 (BP Location: Right Arm)   Pulse 95   Temp (!) 97.2 F (36.2 C) (Temporal)   Resp 16   LMP 04/01/2021   SpO2 96%   Constitutional: Well developed, well nourished, no acute distress.  Coughing Eyes:  EOMI, conjunctiva normal bilaterally HENT: Normocephalic, atraumatic,mucus membranes moist.  Mild nasal congestion.  Normal turbinates.  No maxillary, frontal sinus tenderness.  Erythematous oropharynx, tonsils normal size without exudates.  Uvula midline.  No obvious postnasal drip. Neck: Positive cervical lymphadenopathy Respiratory: Normal inspiratory effort, lungs clear bilaterally Cardiovascular: Normal rate, regular rhythm, no murmurs rubs or gallops GI:  nondistended skin: Flat erythematous rash medial left lower extremity.  No bull's-eye rash.   Musculoskeletal: no deformities Neurologic: Alert & oriented x 3, no focal neuro deficits Psychiatric: Speech and behavior appropriate   ED Course   Medications - No data to display  Orders Placed This Encounter  Procedures   Covid-19, Flu A+B ([facility])    Standing Status:   Standing    Number of Occurrences:   1   Culture, group A strep    Standing Status:   Standing    Number of Occurrences:   1   Specimen status report    Standing Status:   Standing    Number of Occurrences:   1   POCT rapid strep A    Standing Status:   Standing    Number of Occurrences:   1    No results found for this or any previous visit (from the past 24 hour(s)).  No results found. Results for orders placed or performed during the hospital encounter of 04/15/21  Covid-19, Flu A+B ([facility])   Specimen: Nasopharyngeal   Naso  Result Value Ref Range   SARS-CoV-2, NAA Not Detected Not Detected   Influenza A, NAA Not Detected Not Detected   Influenza B, NAA Not Detected Not Detected  Culture, group A strep   Specimen: Throat  Result Value Ref Range   Specimen Description      THROAT Performed at [facility], [address], [place], [place] [address]    Special Requests      NONE Performed at [facility], [address], [place], [place] [address]    Culture      NO GROUP A STREP (S.PYOGENES) ISOLATED Performed at [facility] [address], [place], [place] [address]    Report Status 04/18/2021 FINAL   Specimen status report  Result Value Ref Range   specimen status report Comment   POCT rapid strep A  Result Value Ref Range   Rapid Strep A Screen Negative Negative     ED Clinical Impression  1. Myalgia   2. Exposure to COVID-19 virus   3. Cough      ED Assessment/Plan  Rapid strep negative.  Will send off throat culture.  COVID, flu pending.  Presentation suggestive of  a viral illness, however will send off  [place] spotted fever and Lyme titers because of a rash accompanying the body aches.  Will send home with 10 days doxycycline, Tylenol/ibuprofen, Tessalon, Flonase and Tussionex for the cough at night.  Patient will discontinue the doxycycline if both RMSF and Lyme titers come back negative.  Triamcinolone  for the rash.  [facility] cardiac database reviewed.  Feel that it is appropriate to send  home with a controlled substance prescription.  Flu, COVID, throat culture negative.  Tickborne disease labs pending at the time of the signing of this note.  Discussed labs, imaging, MDM, treatment plan, and plan for follow-up with patient.  patient agrees with plan.   Meds ordered this encounter  Medications   triamcinolone cream (KENALOG) 0.1 %    Sig: Apply 1 application topically 2 (two) times daily. Apply for 2 weeks. May use on face    Dispense:  30 g    Refill:  0   doxycycline (VIBRAMYCIN) 100 MG capsule    Sig: Take 1 capsule (100 mg total) by mouth 2 (two) times daily for 10 days.    Dispense:  20 capsule    Refill:  0   ibuprofen (ADVIL) 600 MG tablet    Sig: Take 1 tablet (600 mg total) by mouth every 6 (six) hours as needed.    Dispense:  30 tablet    Refill:  0   benzonatate (TESSALON) 200 MG capsule    Sig: Take 1 capsule (200 mg total) by mouth 3 (three) times daily as needed for cough.    Dispense:  30 capsule    Refill:  0   chlorpheniramine-HYDROcodone (TUSSIONEX PENNKINETIC ER) 10-8 MG/5ML SUER    Sig: Take 5 mLs by mouth every 12 (twelve) hours as needed for cough.    Dispense:  60 mL    Refill:  0      *This clinic note was created using [employer]. Therefore, there may be occasional mistakes despite careful proofreading.  ?    Melynda Ripple, MD 04/18/21 1239

## 2021-04-15 NOTE — ED Triage Notes (Signed)
Cough x 1 week, feels achy all over 3 home covid tests were negative., sore throat.

## 2021-04-16 LAB — SPECIMEN STATUS REPORT

## 2021-04-17 LAB — COVID-19, FLU A+B NAA
Influenza A, NAA: NOT DETECTED
Influenza B, NAA: NOT DETECTED
SARS-CoV-2, NAA: NOT DETECTED

## 2021-04-18 LAB — CULTURE, GROUP A STREP (THRC)

## 2021-04-20 LAB — ROCKY MTN SPOTTED FVR ABS PNL(IGG+IGM)
RMSF IgG: NEGATIVE
RMSF IgM: 1.41 index — ABNORMAL HIGH (ref 0.00–0.89)

## 2021-04-28 IMAGING — MG MM DIGITAL DIAGNOSTIC BILAT W/ TOMO W/ CAD
8 series · 8 of 24 positions shown · non-contrast
Comparison: Previous outside exam(s).

CLINICAL DATA: 43-year-old female for delayed six-month follow-up
of likely benign LEFT breast abnormality identified on 03/06/2016.
Also for annual bilateral mammogram. History of benign LEFT breast
biopsy.

EXAM:
DIGITAL DIAGNOSTIC BILATERAL MAMMOGRAM WITH CAD AND TOMO
ULTRASOUND LEFT BREAST

[L CC synth-2D]
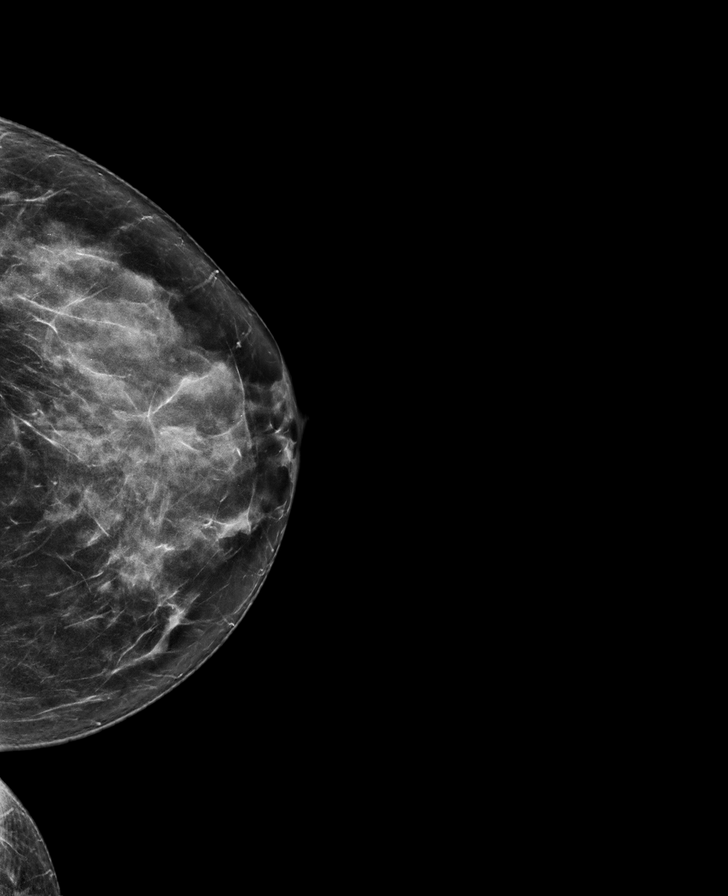

[R MLO synth-2D]
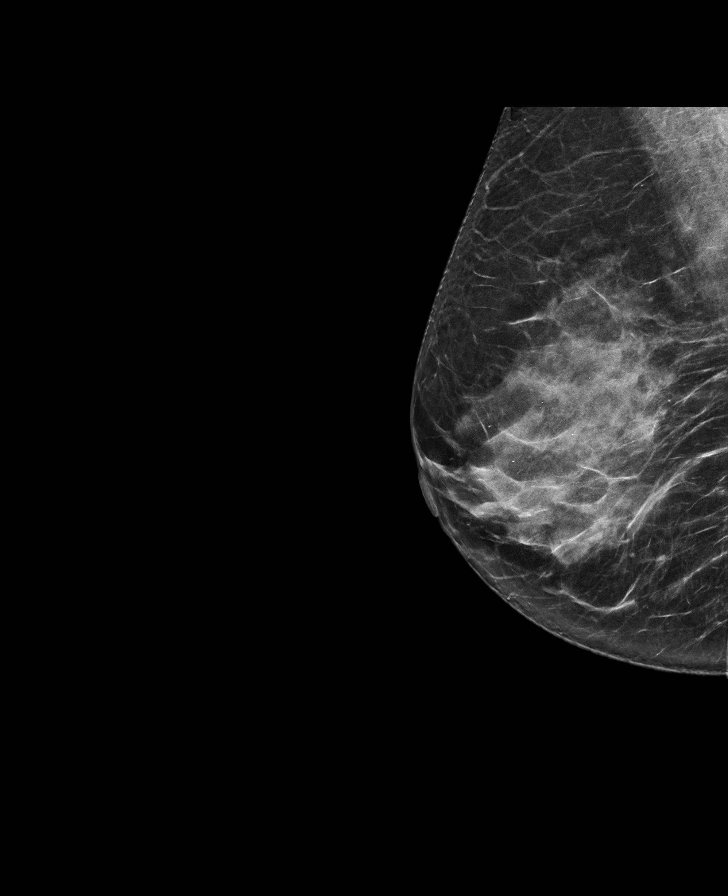

[L MLO synth-2D]
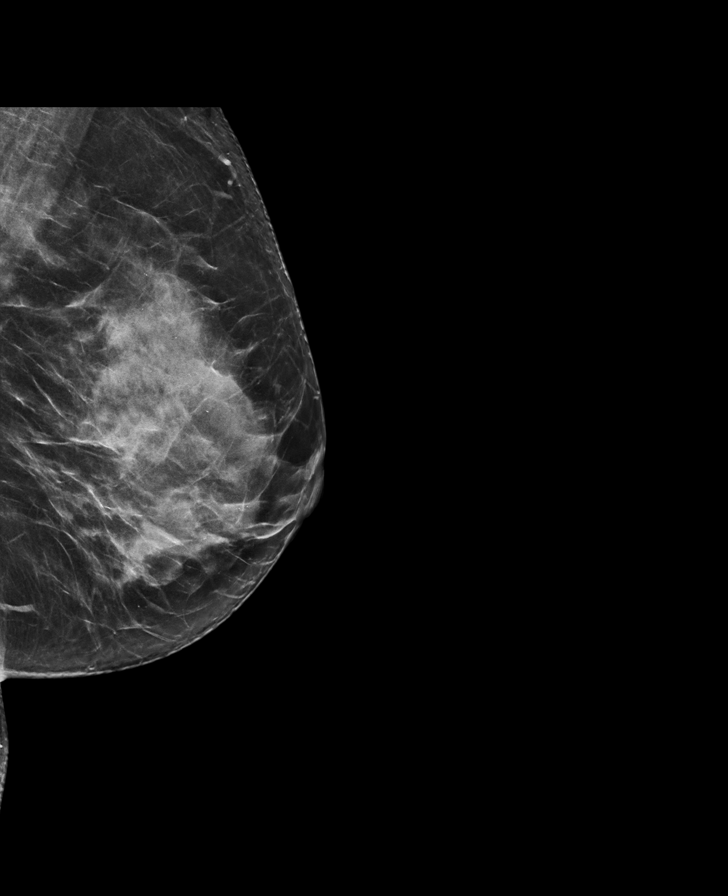

[R CC synth-2D]
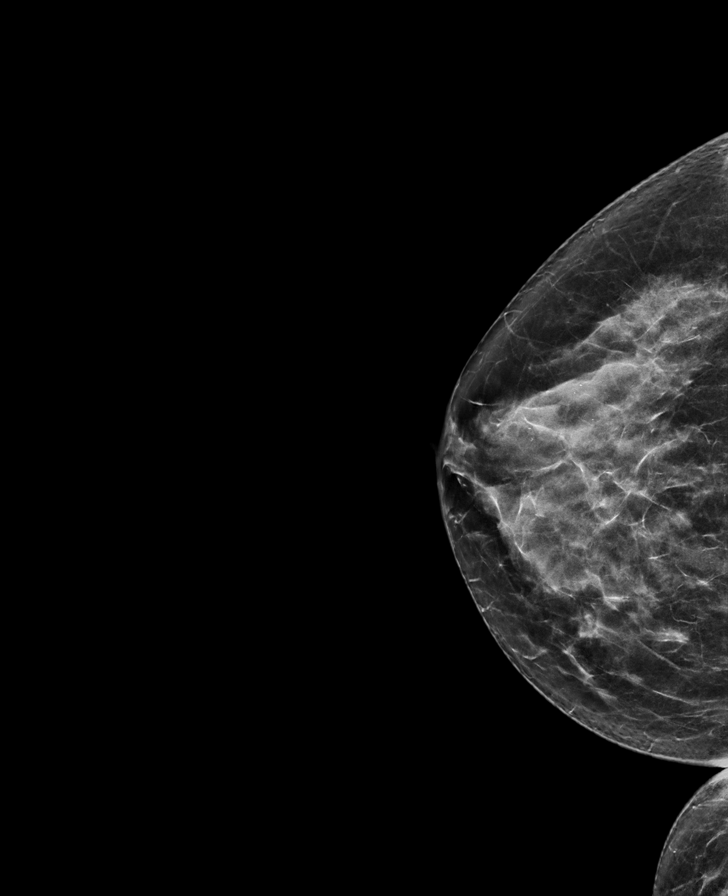

[R MLO tomo · tomo slice 37/72.0]
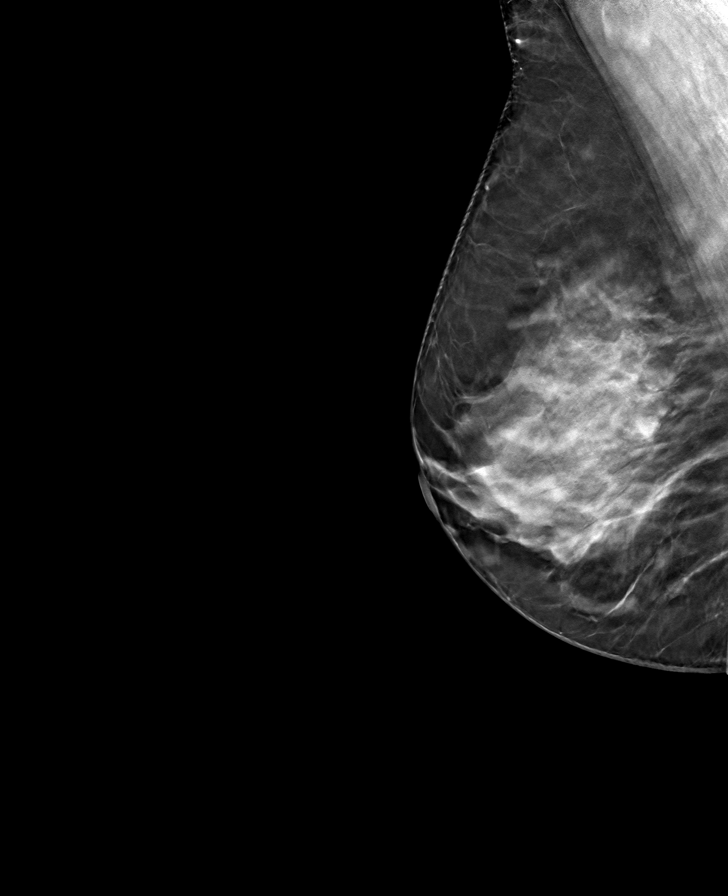

[L CC tomo · tomo slice 35/68.0]
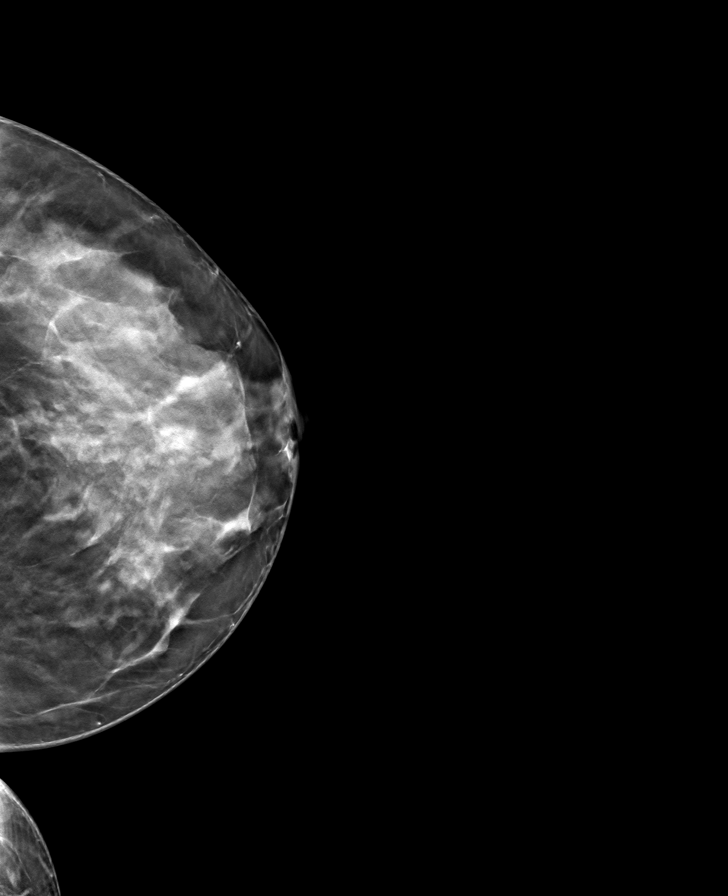

[R CC tomo · tomo slice 35/70.0]
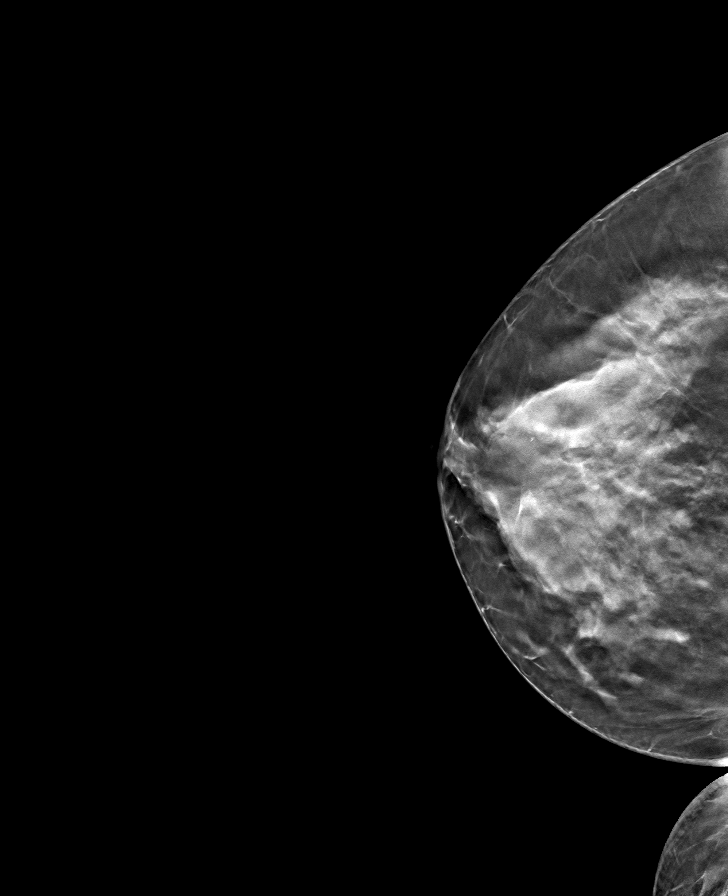

[L MLO tomo · tomo slice 37/72.0]
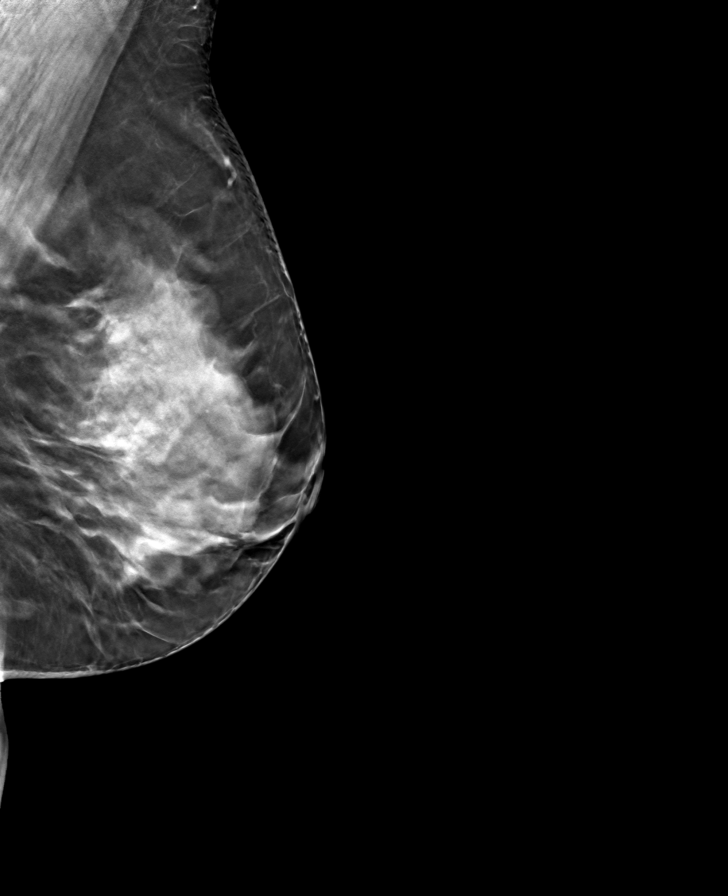

[8 of 24 positions shown; findings below may reference images not displayed]

ACR Breast Density Category d: The breast tissue is extremely dense,
which lowers the sensitivity of mammography.
FINDINGS: 2D/3D full field views of both breasts demonstrate no suspicious
mass, nonsurgical distortion or worrisome calcifications.

Surgical changes within the UPPER LEFT breast again noted.

Mammographic images were processed with CAD.

Targeted ultrasound is performed, showing benign simple cysts at the
12 o'clock position of the LEFT breast 4 cm from the nipple,
decreased in size since 03/06/2016. The largest cyst now measures
0.7 cm, previously 1.2 cm.
IMPRESSION: 1. Benign cysts within the UPPER LEFT breast, decreased in size
since 0175. No further imaging follow-up recommended.
2. No mammographic evidence of breast malignancy.

RECOMMENDATION:
Bilateral screening mammogram in 1 year

I have discussed the findings and recommendations with the patient.
If applicable, a reminder letter will be sent to the patient
regarding the next appointment.

BI-RADS CATEGORY  2: Benign.

## 2021-11-08 ENCOUNTER — Ambulatory Visit: Payer: Self-pay

## 2021-12-13 ENCOUNTER — Ambulatory Visit: Payer: Self-pay

## 2022-01-25 ENCOUNTER — Ambulatory Visit
Admission: RE | Admit: 2022-01-25 | Discharge: 2022-01-25 | Disposition: A | Discharge status: Home / Self Care | Payer: 59 | Source: Ambulatory Visit | Attending: Student | Admitting: Student

## 2022-01-25 ENCOUNTER — Other Ambulatory Visit: Payer: Self-pay

## 2022-01-25 VITALS — BP 129/81 | HR 103 | Temp 98.0°F | Resp 18

## 2022-01-25 DIAGNOSIS — S39012A Strain of muscle, fascia and tendon of lower back, initial encounter: Secondary | ICD-10-CM

## 2022-01-25 LAB — POCT URINALYSIS DIP (MANUAL ENTRY)
Bilirubin, UA: NEGATIVE
Blood, UA: NEGATIVE
Glucose, UA: NEGATIVE mg/dL
Ketones, POC UA: NEGATIVE mg/dL
Leukocytes, UA: NEGATIVE
Nitrite, UA: NEGATIVE
Protein Ur, POC: NEGATIVE mg/dL
Spec Grav, UA: 1.01 (ref 1.010–1.025)
Urobilinogen, UA: 0.2 E.U./dL
pH, UA: 5 (ref 5.0–8.0)

## 2022-01-25 MED ORDER — TIZANIDINE HCL 2 MG PO TABS: 2.0000 mg | ORAL_TABLET | Freq: Three times a day (TID) | ORAL | 0 refills | Status: DC | PRN

## 2022-01-25 NOTE — ED Provider Notes (Signed)
?[facility] ? ? ? ?CSN: [id number] ?Arrival date & time: 01/25/22  1630 ? ? ?  ? ?History   ?Chief Complaint ?Chief Complaint  ?Patient presents with  ? Back Pain  ?  Pain in my side starts in my back, then comes around my stomach - Entered by patient  ? ? ?HPI ?Diane May is a 46 y.o. female presenting with right-sided back pain that radiates to the stomach with movement.  History noncontributory.  States that she does have some pain at rest, but it is worse with movement.  She denies urinary symptoms including abdominal pain, flank pain, fever/chills, dysuria, frequency, hematuria.  She has not tried interventions, though contemplated taking a pill of Azo when she became concerned for UTI. ? ?HPI ? ?Past Medical History:  ?Diagnosis Date  ? Allergy   ? Anxiety   ? Depression   ? Dyslipidemia (high LDL; low HDL)   ? ? ?Patient Active Problem List  ? Diagnosis Date Noted  ? 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 ? ?Past Surgical History:  ?Procedure Laterality Date  ? BREAST SURGERY    ? cyst removal from L breast  ? CESAREAN SECTION    ? x3  ? CHOLECYSTECTOMY    ? ? ?OB History   ? ? Gravida  ?3  ? Para  ?3  ? Term  ?0  ? Preterm  ?0  ? AB  ?0  ? Living  ?3  ?  ? ? SAB  ?0  ? IAB  ?0  ? Ectopic  ?0  ? Multiple  ?0  ? Live Births  ?0  ?   ?  ?  ? ? ? ?Home Medications   ? ?Prior to Admission medications   ?Medication Sig Start Date End Date Taking? Authorizing Provider  ?tiZANidine (ZANAFLEX) 2 MG tablet Take 1 tablet (2 mg total) by mouth every 8 (eight) hours as needed for muscle spasms. 01/25/22  Yes Hazel Sams, PA-C  ?albuterol (VENTOLIN HFA) 108 (90 Base) MCG/ACT inhaler Inhale 2 puffs into the lungs every 4 (four) hours as needed for wheezing or shortness of breath. 01/04/21   [name], FNP  ?citalopram (CELEXA) 10 MG tablet Take 10 mg by mouth daily. 11/25/19   [provider]  ?hypromellose (GENTEAL) 0.3 % GEL ophthalmic ointment Place into both eyes 3 (three) times  daily as needed for dry eyes. 12/02/19   Wurst, [place], PA-C  ?ibuprofen (ADVIL) 600 MG tablet Take 1 tablet (600 mg total) by mouth every 6 (six) hours as needed. 04/15/21   Melynda Ripple, MD  ? ? ?Family History ?Family History  ?Problem Relation Age of Onset  ? Cancer Mother   ? Cancer Father   ? Breast cancer Sister   ?     half sister   ? ? ?Social History ?Social History  ? ?Tobacco Use  ? Smoking status: Never  ? Smokeless tobacco: Never  ?Vaping Use  ? Vaping Use: Never used  ?Substance Use Topics  ? Alcohol use: No  ?  Alcohol/week: 0.0 standard drinks  ? Drug use: No  ? ? ? ?Allergies   ?Penicillins ? ? ?Review of Systems ?Review of Systems  ?Musculoskeletal:  Positive for back pain.  ?All other systems reviewed and are negative. ? ? ?Physical Exam ?Triage Vital Signs ?ED Triage Vitals [01/25/22 1714]  ?Enc Vitals Group  ?   BP 129/81  ?   Pulse Rate (!) 103  ?  Resp 18  ?   Temp 98 ?F (36.7 ?C)  ?   Temp Source Oral  ?   SpO2 95 %  ?   Weight   ?   Height   ?   Head Circumference   ?   Peak Flow   ?   Pain Score 6  ?   Pain Loc   ?   Pain Edu?   ?   Excl. in [place]?   ? ?No data found. ? ?Updated Vital Signs ?BP 129/81 (BP Location: Right Arm)   Pulse (!) 103   Temp 98 ?F (36.7 ?C) (Oral)   Resp 18   LMP 01/19/2022 (Exact Date)   SpO2 95%  ? ?Visual Acuity ?Right Eye Distance:   ?Left Eye Distance:   ?Bilateral Distance:   ? ?Right Eye Near:   ?Left Eye Near:    ?Bilateral Near:    ? ?Physical Exam ?Vitals reviewed.  ?Constitutional:   ?   General: She is not in acute distress. ?   Appearance: Normal appearance. She is not ill-appearing.  ?HENT:  ?   Head: Normocephalic and atraumatic.  ?Cardiovascular:  ?   Rate and Rhythm: Normal rate and regular rhythm.  ?   Heart sounds: Normal heart sounds.  ?Pulmonary:  ?   Effort: Pulmonary effort is normal.  ?   Breath sounds: Normal breath sounds and air entry.  ?Abdominal:  ?   Tenderness: There is no abdominal tenderness. There is no right CVA tenderness,  left CVA tenderness, guarding or rebound.  ?Musculoskeletal:  ?   Cervical back: Normal range of motion. No swelling, deformity, signs of trauma, rigidity, spasms, tenderness, bony tenderness or crepitus. No pain with movement.  ?   Thoracic back: No swelling, deformity, signs of trauma, spasms, tenderness or bony tenderness. Normal range of motion. No scoliosis.  ?   Lumbar back: Spasms and tenderness present. No swelling, deformity, signs of trauma or bony tenderness. Normal range of motion. Negative right straight leg raise test and negative left straight leg raise test. No scoliosis.  ?   Comments: TTP R lumbar paraspinous muscles and side. No midline spinous tenderness, deformity, stepoff. No abd pain. ? ?Absolutely no other injury, deformity, tenderness, ecchymosis, abrasion.  ?Neurological:  ?   General: No focal deficit present.  ?   Mental Status: She is alert.  ?   Cranial Nerves: No cranial nerve deficit.  ?Psychiatric:     ?   Mood and Affect: Mood normal.     ?   Behavior: Behavior normal.     ?   Thought Content: Thought content normal.     ?   Judgment: Judgment normal.  ? ? ? ?UC Treatments / Results  ?Labs ?(all labs ordered are listed, but only abnormal results are displayed) ?Labs Reviewed  ?POCT URINALYSIS DIP (MANUAL ENTRY)  ? ? ?EKG ? ? ?Radiology ?No results found. ? ?Procedures ?Procedures (including critical care time) ? ?Medications Ordered in UC ?Medications - No data to display ? ?Initial Impression / Assessment and Plan / UC Course  ?I have reviewed the triage vital signs and the nursing notes. ? ?Pertinent labs & imaging results that were available during my care of the patient were reviewed by me and considered in my medical decision making (see chart for details). ? ?  ? ?This patient is a very pleasant 46 y.o. year old female presenting with R lumbar strain. LMP 01/19/22, States she is not pregnant or  breastfeeding. ? ?UA wnl, did not check a culture. ? ?Trial of zanaflex, ibuprofen.   ? ?ED return precautions discussed. Patient verbalizes understanding and agreement.  ?  ? ?Final Clinical Impressions(s) / UC Diagnoses  ? ?Final diagnoses:  ?Strain of lumbar region, initial encounter  ? ? ? ?Discharge Instructions   ? ?  ?-Start the muscle relaxer-Zanaflex (tizanidine), up to 3 times daily for muscle spasms and pain.  This can make you drowsy, so take at bedtime or when you do not need to drive or operate machinery. ?-You can take Tylenol up to 1000 mg 3 times daily, and ibuprofen up to 600 mg 3 times daily with food.  You can take these together, or alternate every 3-4 hours. ? ? ? ? ? ?ED Prescriptions   ? ? Medication Sig Dispense Auth. Provider  ? tiZANidine (ZANAFLEX) 2 MG tablet Take 1 tablet (2 mg total) by mouth every 8 (eight) hours as needed for muscle spasms. 21 tablet Hazel Sams, PA-C  ? ?  ? ?PDMP not reviewed this encounter. ?  ?Hazel Sams, PA-C ?01/25/22 1812 ? ?

## 2022-01-25 NOTE — ED Triage Notes (Signed)
Pt states she is having pain in her right side radiating around to her back for about 3 weeks ? ?Pt thought it was a pulled muscle ?

## 2022-01-25 NOTE — Discharge Instructions (Addendum)
-  Start the muscle relaxer-Zanaflex (tizanidine), up to 3 times daily for muscle spasms and pain.  This can make you drowsy, so take at bedtime or when you do not need to drive or operate machinery. ?-You can take Tylenol up to 1000 mg 3 times daily, and ibuprofen up to 600 mg 3 times daily with food.  You can take these together, or alternate every 3-4 hours. ? ?

## 2022-01-26 ENCOUNTER — Telehealth: Payer: Self-pay | Admitting: Emergency Medicine

## 2022-01-26 NOTE — Telephone Encounter (Signed)
Pt called and inquired about anti-inflammatory. Consulted with PA and reported pt could take ibuprofen '600mg'$  every 6-8 hours. Pt asked to speak with someone else and stated but "I need an anti-inflammatory." Discussed with pt that ibuprofen is an anti-inflammatory. Pt noted to be agitated and ended call.  ?

## 2022-03-17 ENCOUNTER — Ambulatory Visit
Admission: EM | Admit: 2022-03-17 | Discharge: 2022-03-17 | Disposition: A | Discharge status: Home / Self Care | Payer: 59 | Attending: Internal Medicine | Admitting: Internal Medicine

## 2022-03-17 ENCOUNTER — Encounter: Payer: Self-pay | Admitting: Emergency Medicine

## 2022-03-17 DIAGNOSIS — H1032 Unspecified acute conjunctivitis, left eye: Secondary | ICD-10-CM

## 2022-03-17 MED ORDER — ERYTHROMYCIN 5 MG/GM OP OINT: TOPICAL_OINTMENT | Freq: Two times a day (BID) | OPHTHALMIC | 0 refills | Status: AC

## 2022-03-17 MED ORDER — OLOPATADINE HCL 0.1 % OP SOLN: 1.0000 [drp] | Freq: Two times a day (BID) | OPHTHALMIC | 0 refills | Status: AC

## 2022-03-17 NOTE — ED Provider Notes (Addendum)
?[facility] ? ? ? ?CSN: [id number] ?Arrival date & time: 03/17/22  2353 ? ? ?  ? ?History   ?Chief Complaint ?No chief complaint on file. ? ? ?HPI ?Diane May is a 46 y.o. female comes to the urgent care with a 2-day history of left eye drainage, eye redness and itching of the left eye.  Patient denies any sick contacts.  No double or blurry vision.  She endorses sensitivity to light.  She wears contacts but has not worn any contact over the past couple of days.  She denies any fever, chills, sore throat or generalized body aches.  ? ?HPI ? ?Past Medical History:  ?Diagnosis Date  ? Allergy   ? Anxiety   ? Depression   ? Dyslipidemia (high LDL; low HDL)   ? ? ?Patient Active Problem List  ? Diagnosis Date Noted  ? 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 ? ?Past Surgical History:  ?Procedure Laterality Date  ? BREAST SURGERY    ? cyst removal from L breast  ? CESAREAN SECTION    ? x3  ? CHOLECYSTECTOMY    ? ? ?OB History   ? ? Gravida  ?3  ? Para  ?3  ? Term  ?0  ? Preterm  ?0  ? AB  ?0  ? Living  ?3  ?  ? ? SAB  ?0  ? IAB  ?0  ? Ectopic  ?0  ? Multiple  ?0  ? Live Births  ?0  ?   ?  ?  ? ? ? ?Home Medications   ? ?Prior to Admission medications   ?Medication Sig Start Date End Date Taking? Authorizing Provider  ?clonazePAM (KLONOPIN) 1 MG tablet Take 1 mg by mouth 2 (two) times daily.   Yes [provider]  ?erythromycin ophthalmic ointment Place into both eyes 2 (two) times daily for 5 days. Place a 1/2 inch ribbon of ointment into the lower eyelid. 03/17/22 03/22/22 Yes Oaklie Durrett, Myrene Galas, MD  ?olopatadine (PATANOL) 0.1 % ophthalmic solution Place 1 drop into the left eye 2 (two) times daily for 7 days. 03/17/22 03/24/22 Yes Morayma Godown, Myrene Galas, MD  ?rosuvastatin (CRESTOR) 10 MG tablet Take 10 mg by mouth daily.   Yes [provider]  ?albuterol (VENTOLIN HFA) 108 (90 Base) MCG/ACT inhaler Inhale 2 puffs into the lungs every 4 (four) hours as needed for wheezing or shortness of  breath. 01/04/21   [name], FNP  ?citalopram (CELEXA) 10 MG tablet Take 10 mg by mouth daily. 11/25/19   [provider]  ?ibuprofen (ADVIL) 600 MG tablet Take 1 tablet (600 mg total) by mouth every 6 (six) hours as needed. 04/15/21   Melynda Ripple, MD  ? ? ?Family History ?Family History  ?Problem Relation Age of Onset  ? Cancer Mother   ? Cancer Father   ? Breast cancer Sister   ?     half sister   ? ? ?Social History ?Social History  ? ?Tobacco Use  ? Smoking status: Never  ? Smokeless tobacco: Never  ?Vaping Use  ? Vaping Use: Never used  ?Substance Use Topics  ? Alcohol use: No  ?  Alcohol/week: 0.0 standard drinks  ? Drug use: No  ? ? ? ?Allergies   ?Penicillins ? ? ?Review of Systems ?Review of Systems  ?Constitutional: Negative.   ?Eyes:  Positive for photophobia, pain, discharge, redness and itching. Negative for visual disturbance.  ?Respiratory: Negative.    ?Cardiovascular:  Negative.   ? ? ?Physical Exam ?Triage Vital Signs ?ED Triage Vitals  ?Enc Vitals Group  ?   BP 03/17/22 0951 126/88  ?   Pulse Rate 03/17/22 0951 95  ?   Resp 03/17/22 0951 18  ?   Temp 03/17/22 0951 98.2 ?F (36.8 ?C)  ?   Temp Source 03/17/22 0951 Oral  ?   SpO2 03/17/22 0951 96 %  ?   Weight --   ?   Height --   ?   Head Circumference --   ?   Peak Flow --   ?   Pain Score 03/17/22 0952 5  ?   Pain Loc --   ?   Pain Edu? --   ?   Excl. in [place]? --   ? ?No data found. ? ?Updated Vital Signs ?BP 126/88 (BP Location: Right Arm)   Pulse 95   Temp 98.2 ?F (36.8 ?C) (Oral)   Resp 18   LMP 03/05/2022 (Approximate)   SpO2 96%  ? ?Visual Acuity ?Right Eye Distance:   ?Left Eye Distance:   ?Bilateral Distance:   ? ?Right Eye Near:   ?Left Eye Near:    ?Bilateral Near:    ? ?Physical Exam ?Vitals and nursing note reviewed.  ?Constitutional:   ?   General: She is not in acute distress. ?   Appearance: She is not ill-appearing.  ?HENT:  ?   Right Ear: Tympanic membrane normal.  ?   Left Ear: Tympanic membrane normal.   ?Eyes:  ?   General:     ?   Right eye: No discharge.  ?   Extraocular Movements: Extraocular movements intact.  ?   Pupils: Pupils are equal, round, and reactive to light.  ?   Comments: Left conjunctival erythema.  No corneal haziness.  ?Cardiovascular:  ?   Rate and Rhythm: Normal rate and regular rhythm.  ?Musculoskeletal:  ?   Cervical back: Normal range of motion.  ?Neurological:  ?   Mental Status: She is alert.  ? ? ? ?UC Treatments / Results  ?Labs ?(all labs ordered are listed, but only abnormal results are displayed) ?Labs Reviewed - No data to display ? ?EKG ? ? ?Radiology ?No results found. ? ?Procedures ?Procedures (including critical care time) ? ?Medications Ordered in UC ?Medications - No data to display ? ?Initial Impression / Assessment and Plan / UC Course  ?I have reviewed the triage vital signs and the nursing notes. ? ?Pertinent labs & imaging results that were available during my care of the patient were reviewed by me and considered in my medical decision making (see chart for details). ? ?  ?1.  Acute conjunctivitis of the left eye: ?Fluorescein stain is negative for corneal abrasion ?Erythromycin eye ointment ?Please avoid contact use until conjunctivitis is completely resolved. ?Patanol eyedrops ?Strict handwashing recommended ?Return precautions given ?This is a likely viral or an irritant event. ?Final Clinical Impressions(s) / UC Diagnoses  ? ?Final diagnoses:  ?Acute conjunctivitis of left eye, unspecified acute conjunctivitis type  ? ? ? ?Discharge Instructions   ? ?  ?Please use eyedrops and eye ointment as prescribed ?Strict handwashing recommended ?If you have blurry vision, double vision or worsening pain in the eye please return to urgent care to be reevaluated. ? ? ?ED Prescriptions   ? ? Medication Sig Dispense Auth. Provider  ? olopatadine (PATANOL) 0.1 % ophthalmic solution Place 1 drop into the left eye 2 (two) times daily for 7  days. 5 mL Marnesha Gagen, Myrene Galas, MD  ?  erythromycin ophthalmic ointment Place into both eyes 2 (two) times daily for 5 days. Place a 1/2 inch ribbon of ointment into the lower eyelid. 3.5 g Avan Gullett, Myrene Galas, MD  ? ?  ? ?PDMP not reviewed this encounter. ?  ?Chase Picket, MD ?03/17/22 1103 ? ?  ?Chase Picket, MD ?03/17/22 1104 ? ?  ?Chase Picket, MD ?03/17/22 1104 ? ?

## 2022-03-17 NOTE — Discharge Instructions (Addendum)
Please use eyedrops and eye ointment as prescribed ?Strict handwashing recommended ?If you have blurry vision, double vision or worsening pain in the eye please return to urgent care to be reevaluated. ?

## 2022-03-17 NOTE — ED Triage Notes (Signed)
Left eye pain with drainage, sensitive to light and itches at times x 2 days ?

## 2022-09-25 ENCOUNTER — Encounter: Payer: Self-pay | Admitting: Emergency Medicine

## 2022-09-25 ENCOUNTER — Ambulatory Visit
Admission: EM | Admit: 2022-09-25 | Discharge: 2022-09-25 | Disposition: A | Discharge status: Home / Self Care | Payer: 59 | Attending: Family Medicine | Admitting: Family Medicine

## 2022-09-25 DIAGNOSIS — Z20828 Contact with and (suspected) exposure to other viral communicable diseases: Secondary | ICD-10-CM | POA: Insufficient documentation

## 2022-09-25 DIAGNOSIS — R059 Cough, unspecified: Secondary | ICD-10-CM | POA: Diagnosis not present

## 2022-09-25 DIAGNOSIS — Z79899 Other long term (current) drug therapy: Secondary | ICD-10-CM | POA: Insufficient documentation

## 2022-09-25 DIAGNOSIS — J069 Acute upper respiratory infection, unspecified: Secondary | ICD-10-CM | POA: Insufficient documentation

## 2022-09-25 DIAGNOSIS — R509 Fever, unspecified: Secondary | ICD-10-CM | POA: Insufficient documentation

## 2022-09-25 DIAGNOSIS — Z1152 Encounter for screening for COVID-19: Secondary | ICD-10-CM | POA: Insufficient documentation

## 2022-09-25 LAB — RESP PANEL BY RT-PCR (FLU A&B, COVID) ARPGX2
Influenza A by PCR: NEGATIVE
Influenza B by PCR: NEGATIVE
SARS Coronavirus 2 by RT PCR: NEGATIVE

## 2022-09-25 MED ORDER — OSELTAMIVIR PHOSPHATE 75 MG PO CAPS: 75.0000 mg | ORAL_CAPSULE | Freq: Two times a day (BID) | ORAL | 0 refills | Status: DC

## 2022-09-25 MED ORDER — PROMETHAZINE-DM 6.25-15 MG/5ML PO SYRP: 5.0000 mL | ORAL_SOLUTION | Freq: Four times a day (QID) | ORAL | 0 refills | Status: DC | PRN

## 2022-09-25 NOTE — ED Triage Notes (Signed)
Cough, feels hot, states she cannot breath x 3 days.  States fever at night time.  Has been taking nyquil to help with symptoms

## 2022-09-25 NOTE — ED Provider Notes (Signed)
[facility]    CSN: [id number] Arrival date & time: 09/25/22  1635      History   Chief Complaint No chief complaint on file.   HPI Diane May is a 46 y.o. female.   Presenting today with 3-day history of cough, fever, chills, body aches, shortness of breath, weakness, fatigue.  Denies chest pain, abdominal pain, nausea vomiting or diarrhea.  Taking DayQuil and NyQuil with minimal relief.  When asked, states that her granddaughter has the flu.    Past Medical History:  Diagnosis Date   Allergy    Anxiety    Depression    Dyslipidemia (high LDL; low HDL)     Patient Active Problem List   Diagnosis Date Noted   Depression with anxiety 08/15/2012    Past Surgical History:  Procedure Laterality Date   BREAST SURGERY     cyst removal from L breast   CESAREAN SECTION     x3   CHOLECYSTECTOMY      OB History     Gravida  3   Para  3   Term  0   Preterm  0   AB  0   Living  3      SAB  0   IAB  0   Ectopic  0   Multiple  0   Live Births  0            Home Medications    Prior to Admission medications   Medication Sig Start Date End Date Taking? Authorizing Provider  oseltamivir (TAMIFLU) 75 MG capsule Take 1 capsule (75 mg total) by mouth every 12 (twelve) hours. 09/25/22  Yes Volney American, PA-C  promethazine-dextromethorphan (PROMETHAZINE-DM) 6.25-15 MG/5ML syrup Take 5 mLs by mouth 4 (four) times daily as needed. 09/25/22  Yes Volney American, PA-C  albuterol (VENTOLIN HFA) 108 (90 Base) MCG/ACT inhaler Inhale 2 puffs into the lungs every 4 (four) hours as needed for wheezing or shortness of breath. 01/04/21   Avegno, Darrelyn Hillock, FNP  citalopram (CELEXA) 10 MG tablet Take 10 mg by mouth daily. 11/25/19   [provider]  clonazePAM (KLONOPIN) 1 MG tablet Take 1 mg by mouth 2 (two) times daily.    [provider]  ibuprofen (ADVIL) 600 MG tablet Take 1 tablet (600 mg total) by mouth  every 6 (six) hours as needed. 04/15/21   Melynda Ripple, MD  rosuvastatin (CRESTOR) 10 MG tablet Take 10 mg by mouth daily.    [provider]    Family History Family History  Problem Relation Age of Onset   Cancer Mother    Cancer Father    Breast cancer Sister        half sister     Social History Social History   Tobacco Use   Smoking status: Never   Smokeless tobacco: Never  Vaping Use   Vaping Use: Never used  Substance Use Topics   Alcohol use: No    Alcohol/week: 0.0 standard drinks of alcohol   Drug use: No     Allergies   Penicillins   Review of Systems Review of Systems PER HPI  Physical Exam Triage Vital Signs ED Triage Vitals  Enc Vitals Group     BP 09/25/22 1709 126/77     Pulse Rate 09/25/22 1709 (!) 122     Resp 09/25/22 1709 18     Temp 09/25/22 1709 100.3 F (37.9 C)     Temp Source  09/25/22 1709 Oral     SpO2 09/25/22 1709 95 %     Weight --      Height --      Head Circumference --      Peak Flow --      Pain Score 09/25/22 1711 6     Pain Loc --      Pain Edu? --      Excl. in [place]? --    No data found.  Updated Vital Signs BP 126/77 (BP Location: Right Arm)   Pulse (!) 122   Temp 100.3 F (37.9 C) (Oral)   Resp 18   LMP 09/09/2022 (Approximate)   SpO2 95%   Visual Acuity Right Eye Distance:   Left Eye Distance:   Bilateral Distance:    Right Eye Near:   Left Eye Near:    Bilateral Near:     Physical Exam Vitals and nursing note reviewed.  Constitutional:      Appearance: Normal appearance.  HENT:     Head: Atraumatic.     Right Ear: Tympanic membrane and external ear normal.     Left Ear: Tympanic membrane and external ear normal.     Nose: Rhinorrhea present.     Mouth/Throat:     Mouth: Mucous membranes are moist.     Pharynx: Posterior oropharyngeal erythema present.  Eyes:     Extraocular Movements: Extraocular movements intact.     Conjunctiva/sclera: Conjunctivae normal.  Cardiovascular:      Rate and Rhythm: Normal rate and regular rhythm.     Heart sounds: Normal heart sounds.  Pulmonary:     Effort: Pulmonary effort is normal.     Breath sounds: Normal breath sounds. No wheezing or rales.  Musculoskeletal:        General: Normal range of motion.     Cervical back: Normal range of motion and neck supple.  Skin:    General: Skin is warm and dry.  Neurological:     Mental Status: She is alert and oriented to person, place, and time.     Motor: No weakness.     Gait: Gait normal.  Psychiatric:        Mood and Affect: Mood normal.        Thought Content: Thought content normal.      UC Treatments / Results  Labs (all labs ordered are listed, but only abnormal results are displayed) Labs Reviewed  RESP PANEL BY RT-PCR (FLU A&B, COVID) [id number]   EKG  Radiology No results found.  Procedures Procedures (including critical care time)  Medications Ordered in UC Medications - No data to display  Initial Impression / Assessment and Plan / UC Course  I have reviewed the triage vital signs and the nursing notes.  Pertinent labs & imaging results that were available during my care of the patient were reviewed by me and considered in my medical decision making (see chart for details).     Febrile and tachycardic in triage, otherwise vital signs reassuring.  Exam suggestive of viral upper respiratory infection, likely influenza particularly given her exposure to influenza.  Will treat with Tamiflu, Phenergan DM, supportive over-the-counter medications and home care.  Return for worsening symptoms.  Final Clinical Impressions(s) / UC Diagnoses   Final diagnoses:  Viral URI with cough  Fever, unspecified  Exposure to influenza   Discharge Instructions   None    ED Prescriptions     Medication Sig Dispense Auth. Provider   promethazine-dextromethorphan (PROMETHAZINE-DM)  6.25-15 MG/5ML syrup Take 5 mLs by mouth 4 (four) times daily as needed. 100 mL Volney American, [place]   oseltamivir (TAMIFLU) 75 MG capsule Take 1 capsule (75 mg total) by mouth every 12 (twelve) hours. 10 capsule Volney American, [place]      PDMP not reviewed this encounter.   Volney American, [place] 09/25/22 1744

## 2022-09-27 ENCOUNTER — Telehealth: Payer: Self-pay | Admitting: Emergency Medicine

## 2022-09-27 NOTE — Telephone Encounter (Signed)
Pt called and inquired about latest results and no improvement of symptoms. Discussed with provider and reported while viral testing was negative, symptoms appear to be related to other virus and that symptoms will have to run their course. Discussed with pt and pt asked if anything be given for cough. Reviewed with pt cough medication was already sent in on 11/22 and is at [facility] on [address]. Pt aware and verbalized understanding.

## 2022-10-01 ENCOUNTER — Telehealth: Payer: Self-pay | Admitting: Emergency Medicine

## 2022-10-01 NOTE — Telephone Encounter (Signed)
Pt called and reported "I am not feeling any better and I think I need an antibiotic." Discussed with provider and reports pt would need to be re-evaluated. Pt verbalized understanding and aware of UC hours.

## 2022-10-02 ENCOUNTER — Ambulatory Visit: Payer: Self-pay

## 2022-12-13 ENCOUNTER — Other Ambulatory Visit: Payer: Self-pay

## 2022-12-13 ENCOUNTER — Emergency Department (HOSPITAL_COMMUNITY): Payer: 59

## 2022-12-13 ENCOUNTER — Inpatient Hospital Stay (HOSPITAL_COMMUNITY)
Admission: EM | Admit: 2022-12-13 | Discharge: 2022-12-14 | DRG: 125 | Disposition: A | Discharge status: Home / Self Care | Payer: 59 | Attending: Internal Medicine | Admitting: Internal Medicine

## 2022-12-13 ENCOUNTER — Encounter (HOSPITAL_COMMUNITY): Payer: Self-pay | Admitting: *Deleted

## 2022-12-13 DIAGNOSIS — Z9049 Acquired absence of other specified parts of digestive tract: Secondary | ICD-10-CM | POA: Diagnosis not present

## 2022-12-13 DIAGNOSIS — E876 Hypokalemia: Secondary | ICD-10-CM | POA: Diagnosis present

## 2022-12-13 DIAGNOSIS — Z79899 Other long term (current) drug therapy: Secondary | ICD-10-CM

## 2022-12-13 DIAGNOSIS — E7800 Pure hypercholesterolemia, unspecified: Secondary | ICD-10-CM | POA: Diagnosis present

## 2022-12-13 DIAGNOSIS — R202 Paresthesia of skin: Secondary | ICD-10-CM | POA: Diagnosis present

## 2022-12-13 DIAGNOSIS — Z832 Family history of diseases of the blood and blood-forming organs and certain disorders involving the immune mechanism: Secondary | ICD-10-CM

## 2022-12-13 DIAGNOSIS — F32A Depression, unspecified: Secondary | ICD-10-CM | POA: Diagnosis present

## 2022-12-13 DIAGNOSIS — F419 Anxiety disorder, unspecified: Secondary | ICD-10-CM | POA: Diagnosis present

## 2022-12-13 DIAGNOSIS — H5711 Ocular pain, right eye: Secondary | ICD-10-CM | POA: Diagnosis present

## 2022-12-13 DIAGNOSIS — H539 Unspecified visual disturbance: Secondary | ICD-10-CM | POA: Diagnosis not present

## 2022-12-13 DIAGNOSIS — H469 Unspecified optic neuritis: Secondary | ICD-10-CM | POA: Diagnosis not present

## 2022-12-13 DIAGNOSIS — Z880 Allergy status to penicillin: Secondary | ICD-10-CM

## 2022-12-13 DIAGNOSIS — R200 Anesthesia of skin: Secondary | ICD-10-CM | POA: Diagnosis present

## 2022-12-13 DIAGNOSIS — Z8269 Family history of other diseases of the musculoskeletal system and connective tissue: Secondary | ICD-10-CM | POA: Diagnosis not present

## 2022-12-13 DIAGNOSIS — H5461 Unqualified visual loss, right eye, normal vision left eye: Secondary | ICD-10-CM | POA: Diagnosis present

## 2022-12-13 DIAGNOSIS — E785 Hyperlipidemia, unspecified: Secondary | ICD-10-CM | POA: Insufficient documentation

## 2022-12-13 DIAGNOSIS — Z803 Family history of malignant neoplasm of breast: Secondary | ICD-10-CM | POA: Diagnosis not present

## 2022-12-13 DIAGNOSIS — H538 Other visual disturbances: Principal | ICD-10-CM | POA: Diagnosis present

## 2022-12-13 LAB — CBC
HCT: 45.5 % (ref 36.0–46.0)
Hemoglobin: 15.3 g/dL — ABNORMAL HIGH (ref 12.0–15.0)
MCH: 29 pg (ref 26.0–34.0)
MCHC: 33.6 g/dL (ref 30.0–36.0)
MCV: 86.3 fL (ref 80.0–100.0)
Platelets: 319 10*3/uL (ref 150–400)
RBC: 5.27 MIL/uL — ABNORMAL HIGH (ref 3.87–5.11)
RDW: 14.3 % (ref 11.5–15.5)
WBC: 9.6 10*3/uL (ref 4.0–10.5)
nRBC: 0 % (ref 0.0–0.2)

## 2022-12-13 LAB — COMPREHENSIVE METABOLIC PANEL
ALT: 31 U/L (ref 0–44)
AST: 20 U/L (ref 15–41)
Albumin: 4.5 g/dL (ref 3.5–5.0)
Alkaline Phosphatase: 134 U/L — ABNORMAL HIGH (ref 38–126)
Anion gap: 12 (ref 5–15)
BUN: 12 mg/dL (ref 6–20)
CO2: 24 mmol/L (ref 22–32)
Calcium: 9.4 mg/dL (ref 8.9–10.3)
Chloride: 99 mmol/L (ref 98–111)
Creatinine, Ser: 0.86 mg/dL (ref 0.44–1.00)
GFR, Estimated: >60 mL/min (ref >60)
Glucose, Bld: 100 mg/dL — ABNORMAL HIGH (ref 70–99)
Potassium: 3.3 mmol/L — ABNORMAL LOW (ref 3.5–5.1)
Sodium: 135 mmol/L (ref 135–145)
Total Bilirubin: 1.4 mg/dL — ABNORMAL HIGH (ref 0.3–1.2)
Total Protein: 8.8 g/dL — ABNORMAL HIGH (ref 6.5–8.1)

## 2022-12-13 LAB — URINALYSIS, ROUTINE W REFLEX MICROSCOPIC
Bilirubin Urine: NEGATIVE
Glucose, UA: NEGATIVE mg/dL
Hgb urine dipstick: NEGATIVE
Ketones, ur: NEGATIVE mg/dL
Leukocytes,Ua: NEGATIVE
Nitrite: NEGATIVE
Protein, ur: NEGATIVE mg/dL
Specific Gravity, Urine: 1.011 (ref 1.005–1.030)
pH: 5 (ref 5.0–8.0)

## 2022-12-13 LAB — DIFFERENTIAL
Abs Immature Granulocytes: 0.03 10*3/uL (ref 0.00–0.07)
Basophils Absolute: 0.1 10*3/uL (ref 0.0–0.1)
Basophils Relative: 1 %
Eosinophils Absolute: 0.1 10*3/uL (ref 0.0–0.5)
Eosinophils Relative: 1 %
Immature Granulocytes: 0 %
Lymphocytes Relative: 26 %
Lymphs Abs: 2.5 10*3/uL (ref 0.7–4.0)
Monocytes Absolute: 0.7 10*3/uL (ref 0.1–1.0)
Monocytes Relative: 7 %
Neutro Abs: 6.2 10*3/uL (ref 1.7–7.7)
Neutrophils Relative %: 65 %

## 2022-12-13 LAB — ETHANOL: Alcohol, Ethyl (B): <10 mg/dL (ref <10)

## 2022-12-13 LAB — RAPID URINE DRUG SCREEN, HOSP PERFORMED
Amphetamines: NOT DETECTED
Barbiturates: NOT DETECTED
Benzodiazepines: NOT DETECTED
Cocaine: NOT DETECTED
Opiates: NOT DETECTED
Tetrahydrocannabinol: NOT DETECTED

## 2022-12-13 LAB — APTT: aPTT: 28 seconds (ref 24–36)

## 2022-12-13 LAB — PROTIME-INR
INR: 1 (ref 0.8–1.2)
Prothrombin Time: 13.1 seconds (ref 11.4–15.2)

## 2022-12-13 MED ORDER — POTASSIUM CHLORIDE CRYS ER 20 MEQ PO TBCR
30.0000 meq | EXTENDED_RELEASE_TABLET | Freq: Once | ORAL | Status: AC
  Administered 2022-12-13: 30 meq via ORAL
  Filled 2022-12-13: qty 1

## 2022-12-13 MED ORDER — ALBUTEROL SULFATE (2.5 MG/3ML) 0.083% IN NEBU: 2.5000 mg | INHALATION_SOLUTION | RESPIRATORY_TRACT | Status: DC | PRN

## 2022-12-13 MED ORDER — ACETAMINOPHEN 650 MG RE SUPP: 650.0000 mg | Freq: Four times a day (QID) | RECTAL | Status: DC | PRN

## 2022-12-13 MED ORDER — SODIUM CHLORIDE 0.9 % IV SOLN
1000.0000 mg | Freq: Once | INTRAVENOUS | Status: AC
  Administered 2022-12-13: 1000 mg via INTRAVENOUS
  Filled 2022-12-13 (×2): qty 16

## 2022-12-13 MED ORDER — SODIUM CHLORIDE 0.9 % IV SOLN
1000.0000 mg | INTRAVENOUS | Status: DC
  Filled 2022-12-13: qty 16

## 2022-12-13 MED ORDER — ONDANSETRON HCL 4 MG PO TABS: 4.0000 mg | ORAL_TABLET | Freq: Four times a day (QID) | ORAL | Status: DC | PRN

## 2022-12-13 MED ORDER — CITALOPRAM HYDROBROMIDE 40 MG PO TABS
40.0000 mg | ORAL_TABLET | Freq: Every day | ORAL | Status: DC
  Administered 2022-12-14: 40 mg via ORAL
  Filled 2022-12-13: qty 1

## 2022-12-13 MED ORDER — ENOXAPARIN SODIUM 40 MG/0.4ML IJ SOSY
40.0000 mg | PREFILLED_SYRINGE | Freq: Every day | INTRAMUSCULAR | Status: DC
  Administered 2022-12-13: 40 mg via SUBCUTANEOUS
  Filled 2022-12-13: qty 0.4

## 2022-12-13 MED ORDER — CLONAZEPAM 0.5 MG PO TABS
0.5000 mg | ORAL_TABLET | Freq: Three times a day (TID) | ORAL | Status: DC | PRN
  Administered 2022-12-13 – 2022-12-14 (×2): 0.5 mg via ORAL
  Filled 2022-12-13 (×2): qty 1

## 2022-12-13 MED ORDER — ACETAMINOPHEN 325 MG PO TABS
650.0000 mg | ORAL_TABLET | Freq: Four times a day (QID) | ORAL | Status: DC | PRN
  Administered 2022-12-13: 650 mg via ORAL
  Filled 2022-12-13: qty 2

## 2022-12-13 MED ORDER — ROSUVASTATIN CALCIUM 5 MG PO TABS
10.0000 mg | ORAL_TABLET | Freq: Every day | ORAL | Status: DC
  Administered 2022-12-14: 10 mg via ORAL
  Filled 2022-12-13: qty 2

## 2022-12-13 MED ORDER — ONDANSETRON HCL 4 MG/2ML IJ SOLN: 4.0000 mg | Freq: Four times a day (QID) | INTRAMUSCULAR | Status: DC | PRN

## 2022-12-13 NOTE — ED Triage Notes (Signed)
Pt with blurry vision to right eye since waking up at 0430 this morning. Pt seen an eye doctor PTA and sent here to r/o stroke. Denies any tingling or weakness to one side. Pt states right eye felt funny yesterday.  + HA, denies any blood thinners.

## 2022-12-13 NOTE — H&P (Signed)
History and Physical    Diane May [id number] DOB: [birth year] DOA: 12/13/2022  I have briefly reviewed the patient's prior medical records in [place]  PCP: Medicine, [facility]  Patient coming from: home  Chief Complaint: blurry vision  HPI: Diane May is a 47 y.o. female with medical history significant of anxiety, hyperlipidemia comes into the hospital with vision changes.  About a week ago she started having pain in the right eye, but over the last 24 hours started develop a slight change in her vision, and when she woke up this morning it was very blurry.  It is now painless.  She saw her optometrist today, who told her that the eye exam was fairly unremarkable and she sent her to the ER to be evaluated for a CVA or MS.  She also reports that over the last 2 months she has noticed intermittent numbness in her right arm and right leg that "comes and goes".  She feels like the leg and arm "go to sleep".  She denies any pain.  She has no personal history of autoimmune disorders, but her twin sister has SLE.  She denies any fever or chills, no abdominal pain, no nausea or vomiting and is otherwise asymptomatic.  ED Course: In the emergency room she is afebrile, normotensive, satting well on room air.  Blood work is fairly unremarkable.  EDP discussed case with Dr. Cheral Marker with neurology who felt like she may have optic neuritis with high suspicion for MS.  Dr. Cheral Marker recommends transfer from [facility] to [facility], MRI of the brain as well as C-spine with and without contrast and initiation of high-dose Solu-Medrol, 1 g for 5 days.  Review of Systems: All systems reviewed, and apart from HPI, all negative  Past Medical History:  Diagnosis Date   Allergy    Anxiety    Depression    Dyslipidemia (high LDL; low HDL)     Past Surgical History:  Procedure Laterality Date   BREAST SURGERY     cyst removal from L breast   CESAREAN SECTION     x3    CHOLECYSTECTOMY       reports that she has never smoked. She has never used smokeless tobacco. She reports that she does not drink alcohol and does not use drugs.  Allergies  Allergen Reactions   Penicillins Rash    Family History  Problem Relation Age of Onset   Cancer Mother    Cancer Father    Breast cancer Sister        half sister     Prior to Admission medications   Medication Sig Start Date End Date Taking? Authorizing Provider  albuterol (VENTOLIN HFA) 108 (90 Base) MCG/ACT inhaler Inhale 2 puffs into the lungs every 4 (four) hours as needed for wheezing or shortness of breath. 01/04/21  Yes Avegno, Darrelyn Hillock, FNP  citalopram (CELEXA) 40 MG tablet Take 40 mg by mouth daily. 12/05/22  Yes [provider]  clonazePAM (KLONOPIN) 0.5 MG tablet Take 0.5 mg by mouth 3 (three) times daily as needed for anxiety. 06/30/22  Yes [provider]  levocetirizine (XYZAL) 5 MG tablet Take 5 mg by mouth at bedtime. 11/12/22  Yes [provider]  phentermine (ADIPEX-P) 37.5 MG tablet Take 37.5 mg by mouth every morning. 12/05/22  Yes [provider]  rosuvastatin (CRESTOR) 10 MG tablet Take 10 mg by mouth daily.   Yes [provider]  ibuprofen (ADVIL) 600 MG  tablet Take 1 tablet (600 mg total) by mouth every 6 (six) hours as needed. Patient not taking: Reported on 12/13/2022 04/15/21   Melynda Ripple, MD  oseltamivir (TAMIFLU) 75 MG capsule Take 1 capsule (75 mg total) by mouth every 12 (twelve) hours. Patient not taking: Reported on 12/13/2022 09/25/22   Volney American, PA-C  promethazine-dextromethorphan (PROMETHAZINE-DM) 6.25-15 MG/5ML syrup Take 5 mLs by mouth 4 (four) times daily as needed. Patient not taking: Reported on 12/13/2022 09/25/22   Volney American, PA-C    Physical Exam: Vitals:   12/13/22 1824 12/13/22 1826 12/13/22 1826  BP:   (!) 147/96  Pulse:   (!) 101  Resp:   14  Temp:  97.6 F (36.4 C)   TempSrc:  Temporal    SpO2:   99%  Weight: 79.4 kg    Height: 5' 4"$  (1.626 m)      Constitutional: NAD, calm, comfortable Eyes: PERRL, lids and conjunctivae normal ENMT: Mucous membranes are moist. Posterior pharynx clear of any exudate or lesions.Normal dentition.  Neck: normal, supple Respiratory: clear to auscultation bilaterally, no wheezing, no crackles. Normal respiratory effort. No accessory muscle use.  Cardiovascular: Regular rate and rhythm, no murmurs / rubs / gallops. No extremity edema. 2+ pedal pulses.  Abdomen: no tenderness, no masses palpated. Bowel sounds positive.  Musculoskeletal: no clubbing / cyanosis. Normal muscle tone.  Skin: no rashes, lesions, ulcers. No induration Neurologic: CN 2-12 grossly intact. Strength 5/5 in all 4.   Labs on Admission: I have personally reviewed following labs and imaging studies  CBC: Recent Labs  Lab 12/13/22 1913  WBC 9.6  NEUTROABS 6.2  HGB 15.3*  HCT 45.5  MCV 86.3  PLT 99991111   Basic Metabolic Panel: Recent Labs  Lab 12/13/22 1913  NA 135  K 3.3*  CL 99  CO2 24  GLUCOSE 100*  BUN 12  CREATININE 0.86  CALCIUM 9.4   Liver Function Tests: Recent Labs  Lab 12/13/22 1913  AST 20  ALT 31  ALKPHOS 134*  BILITOT 1.4*  PROT 8.8*  ALBUMIN 4.5   Coagulation Profile: Recent Labs  Lab 12/13/22 1913  INR 1.0   BNP (last 3 results) No results for input(s): "PROBNP" in the last 8760 hours. CBG: No results for input(s): "GLUCAP" in the last 168 hours. Thyroid Function Tests: No results for input(s): "TSH", "T4TOTAL", "FREET4", "T3FREE", "THYROIDAB" in the last 72 hours. Urine analysis:    Component Value Date/Time   COLORURINE YELLOW 12/13/2022 1900   APPEARANCEUR CLEAR 12/13/2022 1900   LABSPEC 1.011 12/13/2022 1900   PHURINE 5.0 12/13/2022 1900   GLUCOSEU NEGATIVE 12/13/2022 1900   HGBUR NEGATIVE 12/13/2022 1900   BILIRUBINUR NEGATIVE 12/13/2022 1900   BILIRUBINUR negative 01/25/2022 1729   BILIRUBINUR n 12/09/2019 1618    KETONESUR NEGATIVE 12/13/2022 1900   PROTEINUR NEGATIVE 12/13/2022 1900   UROBILINOGEN 0.2 01/25/2022 1729   NITRITE NEGATIVE 12/13/2022 1900   LEUKOCYTESUR NEGATIVE 12/13/2022 1900     Radiological Exams on Admission: CT HEAD WO CONTRAST  Result Date: 12/13/2022 CLINICAL DATA:  Blurred vision in the right eye. EXAM: CT HEAD WITHOUT CONTRAST TECHNIQUE: Contiguous axial images were obtained from the base of the skull through the vertex without intravenous contrast. RADIATION DOSE REDUCTION: This exam was performed according to the departmental dose-optimization program which includes automated exposure control, adjustment of the mA and/or kV according to patient size and/or use of iterative reconstruction technique. COMPARISON:  None Available. FINDINGS: Brain: No evidence of acute  infarction, hemorrhage, hydrocephalus, extra-axial collection or mass lesion/mass effect. Vascular: No hyperdense vessel or unexpected calcification. Skull: No osseous abnormality. Sinuses/Orbits: Bilateral maxillary sinus mucosal thickening. Visualized mastoid sinuses are clear. Visualized orbits demonstrate no focal abnormality. Other: None IMPRESSION: 1. No acute intracranial findings. 2. Bilateral maxillary sinus disease. Electronically Signed   By: Kathreen Devoid M.D.   On: 12/13/2022 19:43    EKG: Independently reviewed. Sinus rhythm RBBB  Assessment/Plan Principal problem High suspicion for MS, optic neuritis -admit patient to [facility], neurology will need to be called upon arrival.  Obtain MRI of the brain and C-spine with and without contrast.  Solu-Medrol 1 g daily for 5 days already started.  Active problems Anxiety -continue home medications  Hyperlipidemia -continue home medications  Hypokalemia -replete potassium  DVT prophylaxis: Lovenox  Code Status: Full code  Family Communication: no family at bedside  Disposition Plan: home when ready  Bed Type: medsurg Consults called: Neurology   Obs/Inp: Inpatient  At the time of admission, it appears that the appropriate admission status for this patient is INPATIENT as it is expected that patient will require hospital care > 2 midnights. This is judged to be reasonable and necessary in order to provide the required intensity of service to ensure the patient's safety given: presenting symptoms, initial radiographic and laboratory data and in the context of their chronic comorbidities. Together, these circumstances are felt to place patient at high at high risk for further clinical deterioration threatening life, limb, or organ.  Marzetta Board, MD, PhD [facility]  Contact via [email or web address]  12/13/2022, 9:04 PM

## 2022-12-13 NOTE — ED Provider Notes (Signed)
[place] Provider Note   CSN: [id number] Arrival date & time: 12/13/22  1815     History {Add pertinent medical, surgical, social history, OB history to HPI:1} Chief Complaint  Patient presents with   Eye Problem    Delta Scearce Vert is a 47 y.o. female.   Eye Problem  This patient is a 47 year old female, she does have a history of anxiety and high cholesterol, she reports that over the last couple of months she has noticed some intermittent tingling in her right arm and her right leg, about a week ago she started having pain behind her right eye with movement of the eye from side-to-side, within the last 24 hours she started to develop a slight change in her vision in the right eye and when she woke up this morning she felt like her vision was completely abnormal where she is only seeing gray and white and black but is not able to make out any characters or shapes.  She states it is painless right now, behind the eye does not cause any pain, she has no fevers or chills no headache, her left eye has totally normal vision, she has no chest pain or shortness of breath.  No difficulty with coordination or strength, her ambulation has been normal.  She went to her optometrist who wanted to go to the emergency department for workup for stroke and MS.    Home Medications Prior to Admission medications   Medication Sig Start Date End Date Taking? Authorizing Provider  albuterol (VENTOLIN HFA) 108 (90 Base) MCG/ACT inhaler Inhale 2 puffs into the lungs every 4 (four) hours as needed for wheezing or shortness of breath. 01/04/21   Avegno, Darrelyn Hillock, FNP  citalopram (CELEXA) 10 MG tablet Take 10 mg by mouth daily. 11/25/19   [provider]  clonazePAM (KLONOPIN) 1 MG tablet Take 1 mg by mouth 2 (two) times daily.    [provider]  ibuprofen (ADVIL) 600 MG tablet Take 1 tablet (600 mg total) by mouth every 6 (six) hours as  needed. 04/15/21   Melynda Ripple, MD  oseltamivir (TAMIFLU) 75 MG capsule Take 1 capsule (75 mg total) by mouth every 12 (twelve) hours. 09/25/22   Volney American, PA-C  promethazine-dextromethorphan (PROMETHAZINE-DM) 6.25-15 MG/5ML syrup Take 5 mLs by mouth 4 (four) times daily as needed. 09/25/22   Volney American, PA-C  rosuvastatin (CRESTOR) 10 MG tablet Take 10 mg by mouth daily.    [provider]      Allergies    Penicillins    Review of Systems   Review of Systems  All other systems reviewed and are negative.   Physical Exam Updated Vital Signs BP (!) 147/96   Pulse (!) 101   Temp 97.6 F (36.4 C) (Temporal)   Resp 14   Ht 1.626 m (5' 4"$ )   Wt 79.4 kg   LMP 11/21/2022 (Approximate)   SpO2 99%   BMI 30.04 kg/m  Physical Exam Vitals and nursing note reviewed.  Constitutional:      General: She is not in acute distress.    Appearance: She is well-developed.  HENT:     Head: Normocephalic and atraumatic.     Mouth/Throat:     Pharynx: No oropharyngeal exudate.  Eyes:     General: No scleral icterus.       Right eye: No discharge.        Left eye: No discharge.  Conjunctiva/sclera: Conjunctivae normal.     Pupils: Pupils are equal, round, and reactive to light.     Comments: Bilateral pupils were dilated prior to arrival.  The retina of the bilateral eyes was visualized without any signs of retinal detachment, the retina have a normal color, the vessels are visualized bilaterally.  There does appear to be some possible papilledema of the right eye, not on the left eye.  Normal-appearing lids and extraocular movements  Neck:     Thyroid: No thyromegaly.     Vascular: No JVD.  Cardiovascular:     Rate and Rhythm: Normal rate and regular rhythm.     Heart sounds: Normal heart sounds. No murmur heard.    No friction rub. No gallop.  Pulmonary:     Effort: Pulmonary effort is normal. No respiratory distress.     Breath sounds: Normal  breath sounds. No wheezing or rales.  Abdominal:     General: Bowel sounds are normal. There is no distension.     Palpations: Abdomen is soft. There is no mass.     Tenderness: There is no abdominal tenderness.  Musculoskeletal:        General: No tenderness. Normal range of motion.     Cervical back: Normal range of motion and neck supple.  Lymphadenopathy:     Cervical: No cervical adenopathy.  Skin:    General: Skin is warm and dry.     Findings: No erythema or rash.  Neurological:     Mental Status: She is alert.     Coordination: Coordination normal.     Comments: Speech is clear, cranial nerves III through XII are intact except for visual acuity in the right eye, the peripheral visual fields of the left eye are completely intact, memory is intact, strength is normal in all 4 extremities including grips, sensation is intact to light touch and pinprick in all 4 extremities. Coordination as tested by finger-nose-finger is normal, no limb ataxia. Normal gait, normal reflexes at the patellar tendons bilaterally  Psychiatric:        Behavior: Behavior normal.     ED Results / Procedures / Treatments   Labs (all labs ordered are listed, but only abnormal results are displayed) Labs Reviewed  ETHANOL  PROTIME-INR  APTT  CBC  DIFFERENTIAL  COMPREHENSIVE METABOLIC PANEL  ETHANOL  RAPID URINE DRUG SCREEN, HOSP PERFORMED  URINALYSIS, ROUTINE W REFLEX MICROSCOPIC    EKG None  Radiology No results found.  Procedures Procedures  {Document cardiac monitor, telemetry assessment procedure when appropriate:1}  Medications Ordered in ED Medications - No data to display  ED Course/ Medical Decision Making/ A&P   {   Click here for ABCD2, HEART and other calculatorsREFRESH Note before signing :1}                          Medical Decision Making Amount and/or Complexity of Data Reviewed Labs: ordered. Radiology: ordered.   This patient presents to the ED for concern of  what appears to be optic neuritis with acute visual changes in the right eye.  I am concerned that this is related to a demyelinating illness or some type of inflammatory illness causing optic neuritis, this is less likely to be related to a central retinal artery or vein occlusion or acute stroke given the appearance of the retina.  That being said the patient will need to scan, labs and consultation with neurology.   Co morbidities that  complicate the patient evaluation  Anxiety, hypercholesterolemia   Additional history obtained:  Additional history obtained from optometry request on a prescription pad External records from outside source obtained and reviewed including they have requested MRI and admission   Lab Tests:  I Ordered, and personally interpreted labs.  The pertinent results include:  ***   Imaging Studies ordered:  I ordered imaging studies including ***  I independently visualized and interpreted imaging which showed *** I agree with the radiologist interpretation   Cardiac Monitoring: / EKG:  The patient was maintained on a cardiac monitor.  I personally viewed and interpreted the cardiac monitored which showed an underlying rhythm of: Normal sinus rhythm   Consultations Obtained:  I requested consultation with the consulted with Dr. Cheral Marker of the neurology service,  and discussed lab and imaging findings as well as pertinent plan - they recommend: MRI with and without contrast of the brain and the cervical spine to look for demyelination and other causes of the patient's symptoms   Problem List / ED Course / Critical interventions / Medication management  *** I ordered medication including Solu-Medrol 1000 mg at the request of neurology for likely optic neuritis Reevaluation of the patient after these medicines showed that the patient {resolved/improved/worsened:23923::"improved"} I have reviewed the patients home medicines and have made adjustments as  needed   Social Determinants of Health:  ***   Test / Admission - Considered:  ***   {Document critical care time when appropriate:1} {Document review of labs and clinical decision tools ie heart score, Chads2Vasc2 etc:1}  {Document your independent review of radiology images, and any outside records:1} {Document your discussion with family members, caretakers, and with consultants:1} {Document social determinants of health affecting pt's care:1} {Document your decision making why or why not admission, treatments were needed:1} Final Clinical Impression(s) / ED Diagnoses Final diagnoses:  None    Rx / DC Orders ED Discharge Orders     None

## 2022-12-14 ENCOUNTER — Inpatient Hospital Stay (HOSPITAL_COMMUNITY): Payer: 59

## 2022-12-14 DIAGNOSIS — H539 Unspecified visual disturbance: Secondary | ICD-10-CM | POA: Diagnosis not present

## 2022-12-14 DIAGNOSIS — E785 Hyperlipidemia, unspecified: Secondary | ICD-10-CM | POA: Insufficient documentation

## 2022-12-14 LAB — COMPREHENSIVE METABOLIC PANEL
ALT: 27 U/L (ref 0–44)
AST: 18 U/L (ref 15–41)
Albumin: 3.5 g/dL (ref 3.5–5.0)
Alkaline Phosphatase: 110 U/L (ref 38–126)
Anion gap: 10 (ref 5–15)
BUN: 13 mg/dL (ref 6–20)
CO2: 22 mmol/L (ref 22–32)
Calcium: 9.3 mg/dL (ref 8.9–10.3)
Chloride: 103 mmol/L (ref 98–111)
Creatinine, Ser: 0.85 mg/dL (ref 0.44–1.00)
GFR, Estimated: >60 mL/min (ref >60)
Glucose, Bld: 174 mg/dL — ABNORMAL HIGH (ref 70–99)
Potassium: 3.7 mmol/L (ref 3.5–5.1)
Sodium: 135 mmol/L (ref 135–145)
Total Bilirubin: 1.1 mg/dL (ref 0.3–1.2)
Total Protein: 7 g/dL (ref 6.5–8.1)

## 2022-12-14 LAB — CBC
HCT: 41.2 % (ref 36.0–46.0)
Hemoglobin: 14.4 g/dL (ref 12.0–15.0)
MCH: 29.9 pg (ref 26.0–34.0)
MCHC: 35 g/dL (ref 30.0–36.0)
MCV: 85.7 fL (ref 80.0–100.0)
Platelets: 296 10*3/uL (ref 150–400)
RBC: 4.81 MIL/uL (ref 3.87–5.11)
RDW: 14.3 % (ref 11.5–15.5)
WBC: 7.9 10*3/uL (ref 4.0–10.5)
nRBC: 0 % (ref 0.0–0.2)

## 2022-12-14 LAB — HIV ANTIBODY (ROUTINE TESTING W REFLEX): HIV Screen 4th Generation wRfx: NONREACTIVE

## 2022-12-14 MED ORDER — GADOBUTROL 1 MMOL/ML IV SOLN
7.5000 mL | Freq: Once | INTRAVENOUS | Status: AC | PRN
  Administered 2022-12-14: 7.5 mL via INTRAVENOUS

## 2022-12-14 NOTE — Discharge Summary (Signed)
Physician Discharge Summary  Diane May [id number] DOB: 06-Aug-1976 DOA: 12/13/2022  PCP: Medicine, [facility]  Admit date: 12/13/2022 Discharge date: 12/14/2022  Admitted From: Home Disposition: Home  Recommendations for Outpatient Follow-up:  Follow up with PCP in 1-2 weeks Follow-up with ophthalmology as discussed for more thorough eye exam  Home Health: None Equipment/Devices: None  Discharge Condition: Stable CODE STATUS: Full Diet recommendation: Regular diet as tolerated  Brief/Interim Summary: Diane May is a 47 y.o. female with medical history significant of anxiety, hyperlipidemia comes into the hospital with ongoing recurrent vision changes.  Patient evaluated by optometry recommending MRI for MS versus CVA workup.  Imaging here was negative for any acute findings -negative for stroke or MS. Neurology consulted initially given questionable diagnosis but given negative imaging we recommend further outpatient workup with ophthalmology.  She carries no personal history of autoimmune disorders but twin sister does have diagnosis of lupus.  At this time patient is stable and agreeable for discharge, outpatient ophthalmology follow-up as discussed.  No medication changes as below.  Discharge Diagnoses:  Principal Problem:   Vision changes Active Problems:   Hyperlipidemia    Discharge Instructions   Allergies as of 12/14/2022       Reactions   Penicillins Rash        Medication List     TAKE these medications    albuterol 108 (90 Base) MCG/ACT inhaler Commonly known as: VENTOLIN HFA Inhale 2 puffs into the lungs every 4 (four) hours as needed for wheezing or shortness of breath.   citalopram 40 MG tablet Commonly known as: CELEXA Take 40 mg by mouth daily.   clonazePAM 0.5 MG tablet Commonly known as: KLONOPIN Take 0.5 mg by mouth 3 (three) times daily as needed for anxiety.   ibuprofen 600 MG tablet Commonly known as:  ADVIL Take 1 tablet (600 mg total) by mouth every 6 (six) hours as needed.   levocetirizine 5 MG tablet Commonly known as: XYZAL Take 5 mg by mouth at bedtime.   oseltamivir 75 MG capsule Commonly known as: TAMIFLU Take 1 capsule (75 mg total) by mouth every 12 (twelve) hours.   phentermine 37.5 MG tablet Commonly known as: ADIPEX-P Take 37.5 mg by mouth every morning.   promethazine-dextromethorphan 6.25-15 MG/5ML syrup Commonly known as: PROMETHAZINE-DM Take 5 mLs by mouth 4 (four) times daily as needed.   rosuvastatin 10 MG tablet Commonly known as: CRESTOR Take 10 mg by mouth daily.        Allergies  Allergen Reactions   Penicillins Rash    Consultations: Neurology   Procedures/Studies: MR THORACIC SPINE W WO CONTRAST  Result Date: 12/14/2022 CLINICAL DATA:  47 year old female with high suspicion of multiple sclerosis. Right eye blurred vision. EXAM: MRI THORACIC WITHOUT AND WITH CONTRAST TECHNIQUE: Multiplanar and multiecho pulse sequences of the thoracic spine were obtained without and with intravenous contrast. CONTRAST:  7.75m GADAVIST GADOBUTROL 1 MMOL/ML IV SOLN in conjunction with contrast enhanced imaging of the brain and cervical spine reported separately. COMPARISON:  Cervical spine MRI reported separately today. FINDINGS: Limited cervical spine imaging:  Detailed separately. Thoracic spine segmentation:  Appears to be normal. Alignment: Relatively normal thoracic kyphosis. No significant scoliosis or spondylolisthesis. Vertebrae: No marrow edema or evidence of acute osseous abnormality. Visualized bone marrow signal is within normal limits. Cord: Capacious spinal canal. Thoracic spinal cord appears normal to the conus medullaris which is at the L1 level. No convincing signal abnormality or volume loss. No abnormal intradural enhancement  or dural thickening. Paraspinal and other soft tissues: Negative. Disc levels: No age advanced thoracic spine degeneration, and  capacious underlying spinal canal. Subtle right paracentral disc bulge or protrusion at T3-T4 (series 22, image 12) without stenosis. IMPRESSION: No evidence of thoracic spinal cord demyelination. Normal for age MRI appearance of the Thoracic Spine. Electronically Signed   By: Genevie Ann M.D.   On: 12/14/2022 06:27   MR CERVICAL SPINE W WO CONTRAST  Result Date: 12/14/2022 CLINICAL DATA:  47 year old female with high suspicion of multiple sclerosis. Right eye blurred vision. EXAM: MRI CERVICAL SPINE WITHOUT AND WITH CONTRAST TECHNIQUE: Multiplanar and multiecho pulse sequences of the cervical spine, to include the craniocervical junction and cervicothoracic junction, were obtained without and with intravenous contrast. CONTRAST:  7.42m GADAVIST GADOBUTROL 1 MMOL/ML IV SOLN in conjunction with contrast enhanced imaging of the brain reported separately. COMPARISON:  Brain MRI the same day reported separately. FINDINGS: Alignment: Mild reversal of cervical lordosis, with subtle degenerative appearing anterolisthesis of C3 on C4 associated with some facet hypertrophy at that level Vertebrae: Chronic degenerative endplate marrow signal changes at C6-C7. Background bone marrow signal within normal limits. No marrow edema or evidence of acute osseous abnormality. Cord: Spinal cord detail mildly degraded by motion artifact throughout this exam, but overall cord signal and morphology appear normal throughout. No abnormal intradural enhancement. No dural thickening. Posterior Fossa, vertebral arteries, paraspinal tissues: Cervicomedullary junction is within normal limits. Posterior fossa detailed separately today. Preserved major vascular flow voids in the neck. The left vertebral artery appears somewhat dominant. Negative visible neck soft tissues. Disc levels: C2-C3:  Mild facet hypertrophy.  No stenosis. C3-C4: Subtle anterolisthesis and disc bulging. Mild to moderate facet hypertrophy greater on the left. Associated mild  left C4 foraminal stenosis. C4-C5:  Negative. C5-C6: Mild disc space loss, disc desiccation, disc bulging and endplate spurring. Left C6 neural foramen most affected, with mild foraminal stenosis there. C6-C7: Disc space loss. Circumferential disc osteophyte complex, with dominant component eccentric to the right and directed toward the right neural foramen (series 15, image 27). Mild spinal stenosis. No convincing cord mass effect. Mild left and moderate to severe right C7 foraminal stenosis. C7-T1: Negative disc. Moderate facet hypertrophy greater on the left. Trace degenerative facet joint fluid on that side. No significant stenosis. Thoracic spine detailed separately. IMPRESSION: 1. No evidence of cervical spinal cord demyelination. 2. Disc and facet degeneration. Subsequent mild spinal stenosis at C6-C7, with moderate to severe right foraminal stenosis. Query Right C7 radiculitis. Electronically Signed   By: HGenevie AnnM.D.   On: 12/14/2022 06:24   MR BRAIN W WO CONTRAST  Result Date: 12/14/2022 CLINICAL DATA:  47year old female with high suspicion of multiple sclerosis. Right eye blurred vision. EXAM: MRI HEAD WITHOUT AND WITH CONTRAST TECHNIQUE: Multiplanar, multiecho pulse sequences of the brain and surrounding structures were obtained without and with intravenous contrast. CONTRAST:  7.531mGADAVIST GADOBUTROL 1 MMOL/ML IV SOLN COMPARISON:  Head CT yesterday. FINDINGS: Brain: Normal cerebral volume. No restricted diffusion to suggest acute infarction. No midline shift, mass effect, evidence of mass lesion, ventriculomegaly, extra-axial collection or acute intracranial hemorrhage. Cervicomedullary junction and pituitary are within normal limits. GrPearline Cablesnd white matter signal is within normal limits for age throughout the brain. No signal changes suggestive of demyelination. No encephalomalacia identified. Asymmetric anterior right frontal lobe susceptibility on SWI series 8, image 60 appears to be associated  with a subtle developmental venous anomaly there (normal variant series 26, image 29. Deep gray  matter nuclei, brainstem and cerebellum appear normal. No abnormal enhancement identified. No dural thickening identified. Vascular: Major intracranial vascular flow voids are preserved. The distal left vertebral artery appears dominant. Following contrast major dural venous sinuses are enhancing and appear to be patent. Skull and upper cervical spine: Cervical spine is detailed separately. Visualized bone marrow signal is within normal limits. Sinuses/Orbits: Orbits soft tissues appear grossly stable and normal on this routine imaging. Bulky bilateral maxillary sinus mucosal thickening and retained secretions. Mild paranasal sinus mucosal thickening elsewhere. Other: Mastoids are clear. Visible internal auditory structures appear normal. Negative visible scalp and face. IMPRESSION: 1. Normal for age MRI appearance of the Brain. No evidence of demyelinating disease. 2. Bilateral maxillary sinusitis. Electronically Signed   By: Genevie Ann M.D.   On: 12/14/2022 06:19   CT HEAD WO CONTRAST  Result Date: 12/13/2022 CLINICAL DATA:  Blurred vision in the right eye. EXAM: CT HEAD WITHOUT CONTRAST TECHNIQUE: Contiguous axial images were obtained from the base of the skull through the vertex without intravenous contrast. RADIATION DOSE REDUCTION: This exam was performed according to the departmental dose-optimization program which includes automated exposure control, adjustment of the mA and/or kV according to patient size and/or use of iterative reconstruction technique. COMPARISON:  None Available. FINDINGS: Brain: No evidence of acute infarction, hemorrhage, hydrocephalus, extra-axial collection or mass lesion/mass effect. Vascular: No hyperdense vessel or unexpected calcification. Skull: No osseous abnormality. Sinuses/Orbits: Bilateral maxillary sinus mucosal thickening. Visualized mastoid sinuses are clear. Visualized orbits  demonstrate no focal abnormality. Other: None IMPRESSION: 1. No acute intracranial findings. 2. Bilateral maxillary sinus disease. Electronically Signed   By: Kathreen Devoid M.D.   On: 12/13/2022 19:43     Subjective: No acute issues or events overnight   Discharge Exam: Vitals:   12/14/22 0409 12/14/22 0726  BP: 129/82 (!) 149/85  Pulse: 85 96  Resp: 18 20  Temp: 97.8 F (36.6 C) 98.6 F (37 C)  SpO2: 96% 97%   Vitals:   12/13/22 2152 12/13/22 2310 12/14/22 0409 12/14/22 0726  BP: 130/82 (!) 141/81 129/82 (!) 149/85  Pulse: 95 96 85 96  Resp: 18 20 18 20  $ Temp: 98.5 F (36.9 C) 98.1 F (36.7 C) 97.8 F (36.6 C) 98.6 F (37 C)  TempSrc: Oral Oral Oral Oral  SpO2: 97% 97% 96% 97%  Weight:      Height:        General: Pt is alert, awake, not in acute distress Cardiovascular: RRR, S1/S2 +, no rubs, no gallops Respiratory: CTA bilaterally, no wheezing, no rhonchi Abdominal: Soft, NT, ND, bowel sounds + Extremities: no edema, no cyanosis    The results of significant diagnostics from this hospitalization (including imaging, microbiology, ancillary and laboratory) are listed below for reference.     Microbiology: No results found for this or any previous visit (from the past 240 hour(s)).   Labs: BNP (last 3 results) No results for input(s): "BNP" in the last 8760 hours. Basic Metabolic Panel: Recent Labs  Lab 12/13/22 1913 12/14/22 0248  NA 135 135  K 3.3* 3.7  CL 99 103  CO2 24 22  GLUCOSE 100* 174*  BUN 12 13  CREATININE 0.86 0.85  CALCIUM 9.4 9.3   Liver Function Tests: Recent Labs  Lab 12/13/22 1913 12/14/22 0248  AST 20 18  ALT 31 27  ALKPHOS 134* 110  BILITOT 1.4* 1.1  PROT 8.8* 7.0  ALBUMIN 4.5 3.5   No results for input(s): "LIPASE", "AMYLASE" in the last  168 hours. No results for input(s): "AMMONIA" in the last 168 hours. CBC: Recent Labs  Lab 12/13/22 1913 12/14/22 0248  WBC 9.6 7.9  NEUTROABS 6.2  --   HGB 15.3* 14.4  HCT 45.5  41.2  MCV 86.3 85.7  PLT 319 296   Cardiac Enzymes: No results for input(s): "CKTOTAL", "CKMB", "CKMBINDEX", "TROPONINI" in the last 168 hours. BNP: Invalid input(s): "POCBNP" CBG: No results for input(s): "GLUCAP" in the last 168 hours. D-Dimer No results for input(s): "DDIMER" in the last 72 hours. Hgb A1c No results for input(s): "HGBA1C" in the last 72 hours. Lipid Profile No results for input(s): "CHOL", "HDL", "LDLCALC", "TRIG", "CHOLHDL", "LDLDIRECT" in the last 72 hours. Thyroid function studies No results for input(s): "TSH", "T4TOTAL", "T3FREE", "THYROIDAB" in the last 72 hours.  Invalid input(s): "FREET3" Anemia work up No results for input(s): "VITAMINB12", "FOLATE", "FERRITIN", "TIBC", "IRON", "RETICCTPCT" in the last 72 hours. Urinalysis    Component Value Date/Time   COLORURINE YELLOW 12/13/2022 1900   APPEARANCEUR CLEAR 12/13/2022 1900   LABSPEC 1.011 12/13/2022 1900   PHURINE 5.0 12/13/2022 1900   GLUCOSEU NEGATIVE 12/13/2022 1900   HGBUR NEGATIVE 12/13/2022 1900   BILIRUBINUR NEGATIVE 12/13/2022 1900   BILIRUBINUR negative 01/25/2022 1729   BILIRUBINUR n 12/09/2019 1618   KETONESUR NEGATIVE 12/13/2022 1900   PROTEINUR NEGATIVE 12/13/2022 1900   UROBILINOGEN 0.2 01/25/2022 1729   NITRITE NEGATIVE 12/13/2022 1900   LEUKOCYTESUR NEGATIVE 12/13/2022 1900   Sepsis Labs Recent Labs  Lab 12/13/22 1913 12/14/22 0248  WBC 9.6 7.9   Microbiology No results found for this or any previous visit (from the past 240 hour(s)).   Time coordinating discharge: Over 30 minutes  SIGNED:   Little Ishikawa, DO [facility] 12/14/2022, 11:09 AM Pager   If 7PM-7AM, please contact night-coverage [email or web address]

## 2022-12-14 NOTE — Plan of Care (Signed)

## 2022-12-14 NOTE — Progress Notes (Signed)
Patient given discharge instructions and verbalizes understanding. PIV x1 removed patient dressed herself. Patient discharged home with family via wheelchair.

## 2022-12-14 NOTE — Consult Note (Signed)
Neurology Consultation  Reason for Consult: painful monocular vision loss  Referring Physician: Dr. Cruzita Lederer  CC: painful vision loss  History is obtained from: patient and medical record   HPI: Diane May is a 47 y.o. female with past medical history of anxiety and depression and HLD who presents to the ED for evaluation of one week of right eye pain with lateral movements and vision loss in the right eye. She visited her optometrist who sent her to the ED for evaluation.  CT head, MRI brain, with no acute process; Mri cervical and thoracic spine with no evidence of cord demyelination   Patient states she has pain in right eye with lateral movements and is unable to clearly make out images and cannot see color. She states the pain started on Thursday afternoon and persisted. She denies, headache, jaw pain when chewing or temporal pain, weakness, slurred speech or facial droop. She endorses at times tingling in bilateral feet and hands  She has received one does of high dose 1058m solumedrol.   Dr. ARory Percydiscussed with patient that given negative imaging and no acute process, recommend patient following up with ophthalmologist as outpatient.   ROS: Full ROS was performed and is negative except as noted in the HPI.    Past Medical History:  Diagnosis Date   Allergy    Anxiety    Depression    Dyslipidemia (high LDL; low HDL)      Family History  Problem Relation Age of Onset   Cancer Mother    Cancer Father    Breast cancer Sister        half sister      Social History:   reports that she has never smoked. She has never used smokeless tobacco. She reports that she does not drink alcohol and does not use drugs.  Medications  Current Facility-Administered Medications:    acetaminophen (TYLENOL) tablet 650 mg, 650 mg, Oral, Q6H PRN, 650 mg at 12/13/22 2237 **OR** acetaminophen (TYLENOL) suppository 650 mg, 650 mg, Rectal, Q6H PRN, Gherghe, Costin M, MD   albuterol  (PROVENTIL) (2.5 MG/3ML) 0.083% nebulizer solution 2.5 mg, 2.5 mg, Inhalation, Q4H PRN, GCruzita Lederer CVella Redhead MD   citalopram (CELEXA) tablet 40 mg, 40 mg, Oral, Daily, GCaren Griffins MD, 40 mg at 12/14/22 0801   clonazePAM (KLONOPIN) tablet 0.5 mg, 0.5 mg, Oral, TID PRN, GCaren Griffins MD, 0.5 mg at 12/14/22 1026   enoxaparin (LOVENOX) injection 40 mg, 40 mg, Subcutaneous, QHS, Gherghe, Costin M, MD, 40 mg at 12/13/22 2232   ondansetron (ZOFRAN) tablet 4 mg, 4 mg, Oral, Q6H PRN **OR** ondansetron (ZOFRAN) injection 4 mg, 4 mg, Intravenous, Q6H PRN, GCruzita Lederer CVella Redhead MD   rosuvastatin (CRESTOR) tablet 10 mg, 10 mg, Oral, Daily, GCaren Griffins MD, 10 mg at 12/14/22 0801   Exam: Current vital signs: BP (!) 149/85 (BP Location: Right Arm)   Pulse 96   Temp 98.6 F (37 C) (Oral)   Resp 20   Ht 5' 4"$  (1.626 m)   Wt 79.4 kg   LMP 11/21/2022 (Approximate)   SpO2 97%   BMI 30.04 kg/m  Vital signs in last 24 hours: Temp:  [97.6 F (36.4 C)-98.6 F (37 C)] 98.6 F (37 C) (02/10 0726) Pulse Rate:  [85-101] 96 (02/10 0726) Resp:  [14-20] 20 (02/10 0726) BP: (129-149)/(81-96) 149/85 (02/10 0726) SpO2:  [96 %-99 %] 97 % (02/10 0726) Weight:  [79.4 kg] 79.4 kg (02/09 1824)  GENERAL: Awake, alert  in NAD HEENT: - Normocephalic and atraumatic, dry mm LUNGS - Clear to auscultation bilaterally with no wheezes CV - S1S2 RRR, no m/r/g, equal pulses bilaterally. ABDOMEN - Soft, nontender, nondistended with normoactive BS Ext: warm, well perfused, intact peripheral pulses, __ edema  NEURO:  Mental Status: AA&Ox4 Language: speech is clear.  Naming, repetition, fluency, and comprehension intact. Cranial Nerves: PERRL EOMI, visual fields full, no facial asymmetry, facial sensation intact, hearing intact, tongue/uvula/soft palate midline, normal sternocleidomastoid and trapezius muscle strength. No evidence of tongue atrophy or fibrillations Motor: 5/5 in all 4 extremities Tone: is normal  and bulk is normal Sensation- Intact to light touch bilaterally Coordination: FTN intact bilaterally, no ataxia in BLE. Gait- deferred  Labs I have reviewed labs in epic and the results pertinent to this consultation are:  CBC    Component Value Date/Time   WBC 7.9 12/14/2022 0248   RBC 4.81 12/14/2022 0248   HGB 14.4 12/14/2022 0248   HGB 16.3 (H) 03/02/2020 1349   HCT 41.2 12/14/2022 0248   HCT 47.6 (H) 03/02/2020 1349   PLT 296 12/14/2022 0248   PLT 336 03/02/2020 1349   MCV 85.7 12/14/2022 0248   MCV 90 03/02/2020 1349   MCH 29.9 12/14/2022 0248   MCHC 35.0 12/14/2022 0248   RDW 14.3 12/14/2022 0248   RDW 12.6 03/02/2020 1349   LYMPHSABS 2.5 12/13/2022 1913   MONOABS 0.7 12/13/2022 1913   EOSABS 0.1 12/13/2022 1913   BASOSABS 0.1 12/13/2022 1913    CMP     Component Value Date/Time   NA 135 12/14/2022 0248   NA 138 08/02/2019 0851   K 3.7 12/14/2022 0248   CL 103 12/14/2022 0248   CO2 22 12/14/2022 0248   GLUCOSE 174 (H) 12/14/2022 0248   BUN 13 12/14/2022 0248   BUN 10 08/02/2019 0851   CREATININE 0.85 12/14/2022 0248   CALCIUM 9.3 12/14/2022 0248   PROT 7.0 12/14/2022 0248   PROT 6.6 08/02/2019 0851   ALBUMIN 3.5 12/14/2022 0248   ALBUMIN 4.0 08/02/2019 0851   AST 18 12/14/2022 0248   ALT 27 12/14/2022 0248   ALKPHOS 110 12/14/2022 0248   BILITOT 1.1 12/14/2022 0248   BILITOT 0.7 08/02/2019 0851   GFRNONAA >60 12/14/2022 0248   GFRAA 93 08/02/2019 0851    Lipid Panel     Component Value Date/Time   CHOL 208 (H) 08/02/2019 0851   TRIG 139 08/02/2019 0851   HDL 26 (L) 08/02/2019 0851   CHOLHDL 8.0 (H) 08/02/2019 0851   LDLCALC 156 (H) 08/02/2019 0851     Imaging I have reviewed the images obtained:  CT-head 1. No acute intracranial findings. 2. Bilateral maxillary sinus disease.   MRI examination of the brain Normal   Mri cervical spine -. No evidence of cervical spinal cord demyelination   MRI thoracic spine -No evidence of thoracic  spinal cord demyelination   Assessment:  47 y.o. female with past medical history of anxiety and depression and HLD who presents to the ED for evaluation of one week of right eye pain with lateral movements and vision loss in the right eye. She visited her optometrist who sent her to the ED for evaluation.   Work up has been negative for MS and stroke.   Recommendations: - recommend outpatient follow up with ophthalmologist  - No need for high dose steroids at this time  - neurology will sign off. Please call with questions or concerns.  Has been discussed with Dr.  [place] at the bedside   [place], [place]  [facility]  Attending Neurohospitalist Addendum Patient seen and examined with APP/Resident. Agree with the history and physical as documented above. Agree with the plan as documented, which I helped formulate. I have independently reviewed the chart, obtained history, review of systems and examined the patient.I have personally reviewed pertinent head/neck/spine imaging (CT/MRI). Please feel free to call with any questions.  -- Amie Portland, MD Neurologist [facility] Pager: [phone]

## 2022-12-17 ENCOUNTER — Encounter (INDEPENDENT_AMBULATORY_CARE_PROVIDER_SITE_OTHER): Payer: Self-pay | Admitting: Ophthalmology

## 2022-12-17 ENCOUNTER — Inpatient Hospital Stay (HOSPITAL_COMMUNITY)
Admission: EM | Admit: 2022-12-17 | Discharge: 2022-12-21 | DRG: 123 | Disposition: A | Discharge status: Home / Self Care | Payer: 59 | Attending: Internal Medicine | Admitting: Internal Medicine

## 2022-12-17 ENCOUNTER — Other Ambulatory Visit: Payer: Self-pay

## 2022-12-17 ENCOUNTER — Ambulatory Visit (INDEPENDENT_AMBULATORY_CARE_PROVIDER_SITE_OTHER): Payer: 59 | Admitting: Ophthalmology

## 2022-12-17 ENCOUNTER — Encounter (HOSPITAL_COMMUNITY): Payer: Self-pay | Admitting: Emergency Medicine

## 2022-12-17 DIAGNOSIS — R739 Hyperglycemia, unspecified: Secondary | ICD-10-CM | POA: Diagnosis present

## 2022-12-17 DIAGNOSIS — H469 Unspecified optic neuritis: Principal | ICD-10-CM | POA: Diagnosis present

## 2022-12-17 DIAGNOSIS — F418 Other specified anxiety disorders: Secondary | ICD-10-CM | POA: Diagnosis not present

## 2022-12-17 DIAGNOSIS — H5461 Unqualified visual loss, right eye, normal vision left eye: Secondary | ICD-10-CM | POA: Diagnosis present

## 2022-12-17 DIAGNOSIS — F32A Depression, unspecified: Secondary | ICD-10-CM | POA: Diagnosis present

## 2022-12-17 DIAGNOSIS — Z880 Allergy status to penicillin: Secondary | ICD-10-CM | POA: Diagnosis not present

## 2022-12-17 DIAGNOSIS — F419 Anxiety disorder, unspecified: Secondary | ICD-10-CM | POA: Diagnosis present

## 2022-12-17 DIAGNOSIS — E785 Hyperlipidemia, unspecified: Secondary | ICD-10-CM | POA: Diagnosis not present

## 2022-12-17 DIAGNOSIS — E876 Hypokalemia: Secondary | ICD-10-CM | POA: Diagnosis not present

## 2022-12-17 DIAGNOSIS — T380X5A Adverse effect of glucocorticoids and synthetic analogues, initial encounter: Secondary | ICD-10-CM | POA: Diagnosis present

## 2022-12-17 DIAGNOSIS — H25813 Combined forms of age-related cataract, bilateral: Secondary | ICD-10-CM | POA: Diagnosis not present

## 2022-12-17 DIAGNOSIS — Z79899 Other long term (current) drug therapy: Secondary | ICD-10-CM

## 2022-12-17 LAB — CBC WITH DIFFERENTIAL/PLATELET
Abs Immature Granulocytes: 0.03 10*3/uL (ref 0.00–0.07)
Basophils Absolute: 0.1 10*3/uL (ref 0.0–0.1)
Basophils Relative: 1 %
Eosinophils Absolute: 0.1 10*3/uL (ref 0.0–0.5)
Eosinophils Relative: 1 %
HCT: 43.8 % (ref 36.0–46.0)
Hemoglobin: 14.7 g/dL (ref 12.0–15.0)
Immature Granulocytes: 0 %
Lymphocytes Relative: 30 %
Lymphs Abs: 3.4 10*3/uL (ref 0.7–4.0)
MCH: 29.2 pg (ref 26.0–34.0)
MCHC: 33.6 g/dL (ref 30.0–36.0)
MCV: 87.1 fL (ref 80.0–100.0)
Monocytes Absolute: 0.8 10*3/uL (ref 0.1–1.0)
Monocytes Relative: 7 %
Neutro Abs: 6.9 10*3/uL (ref 1.7–7.7)
Neutrophils Relative %: 61 %
Platelets: 332 10*3/uL (ref 150–400)
RBC: 5.03 MIL/uL (ref 3.87–5.11)
RDW: 14.3 % (ref 11.5–15.5)
WBC: 11.4 10*3/uL — ABNORMAL HIGH (ref 4.0–10.5)
nRBC: 0 % (ref 0.0–0.2)

## 2022-12-17 LAB — BASIC METABOLIC PANEL
Anion gap: 10 (ref 5–15)
BUN: 16 mg/dL (ref 6–20)
CO2: 25 mmol/L (ref 22–32)
Calcium: 9.2 mg/dL (ref 8.9–10.3)
Chloride: 101 mmol/L (ref 98–111)
Creatinine, Ser: 0.85 mg/dL (ref 0.44–1.00)
GFR, Estimated: >60 mL/min (ref >60)
Glucose, Bld: 115 mg/dL — ABNORMAL HIGH (ref 70–99)
Potassium: 3.4 mmol/L — ABNORMAL LOW (ref 3.5–5.1)
Sodium: 136 mmol/L (ref 135–145)

## 2022-12-17 MED ORDER — SODIUM CHLORIDE 0.9 % IV SOLN
1000.0000 mg | INTRAVENOUS | Status: DC
  Administered 2022-12-18 – 2022-12-19 (×2): 1000 mg via INTRAVENOUS
  Filled 2022-12-17 (×2): qty 16

## 2022-12-17 MED ORDER — ENOXAPARIN SODIUM 40 MG/0.4ML IJ SOSY
40.0000 mg | PREFILLED_SYRINGE | INTRAMUSCULAR | Status: DC
  Administered 2022-12-17 – 2022-12-20 (×4): 40 mg via SUBCUTANEOUS
  Filled 2022-12-17 (×4): qty 0.4

## 2022-12-17 MED ORDER — PANTOPRAZOLE SODIUM 40 MG IV SOLR
40.0000 mg | INTRAVENOUS | Status: DC
  Administered 2022-12-17 – 2022-12-18 (×2): 40 mg via INTRAVENOUS
  Filled 2022-12-17 (×3): qty 10

## 2022-12-17 MED ORDER — ACETAMINOPHEN 650 MG RE SUPP: 650.0000 mg | Freq: Four times a day (QID) | RECTAL | Status: DC | PRN

## 2022-12-17 MED ORDER — SODIUM CHLORIDE 0.9 % IV SOLN: 1000.0000 mg | Freq: Every day | INTRAVENOUS | Status: DC

## 2022-12-17 MED ORDER — SODIUM CHLORIDE 0.9 % IV SOLN
1000.0000 mg | Freq: Once | INTRAVENOUS | Status: AC
  Administered 2022-12-17: 1000 mg via INTRAVENOUS
  Filled 2022-12-17: qty 16

## 2022-12-17 MED ORDER — ACETAMINOPHEN 325 MG PO TABS
650.0000 mg | ORAL_TABLET | Freq: Four times a day (QID) | ORAL | Status: DC | PRN
  Administered 2022-12-18: 650 mg via ORAL
  Filled 2022-12-17: qty 2

## 2022-12-17 MED ORDER — POTASSIUM CHLORIDE CRYS ER 20 MEQ PO TBCR
40.0000 meq | EXTENDED_RELEASE_TABLET | Freq: Once | ORAL | Status: AC
  Administered 2022-12-17: 40 meq via ORAL
  Filled 2022-12-17: qty 2

## 2022-12-17 MED ORDER — SENNOSIDES-DOCUSATE SODIUM 8.6-50 MG PO TABS: 1.0000 | ORAL_TABLET | Freq: Every evening | ORAL | Status: DC | PRN

## 2022-12-17 MED ORDER — ALBUTEROL SULFATE (2.5 MG/3ML) 0.083% IN NEBU: 2.5000 mg | INHALATION_SOLUTION | RESPIRATORY_TRACT | Status: DC | PRN

## 2022-12-17 MED ORDER — CITALOPRAM HYDROBROMIDE 20 MG PO TABS
40.0000 mg | ORAL_TABLET | Freq: Every day | ORAL | Status: DC
  Administered 2022-12-17 – 2022-12-21 (×5): 40 mg via ORAL
  Filled 2022-12-17: qty 2
  Filled 2022-12-17 (×4): qty 4
  Filled 2022-12-17 (×2): qty 2

## 2022-12-17 MED ORDER — ROSUVASTATIN CALCIUM 5 MG PO TABS
10.0000 mg | ORAL_TABLET | Freq: Every day | ORAL | Status: DC
  Administered 2022-12-17 – 2022-12-21 (×5): 10 mg via ORAL
  Filled 2022-12-17 (×5): qty 2

## 2022-12-17 MED ORDER — CLONAZEPAM 0.5 MG PO TABS
0.5000 mg | ORAL_TABLET | Freq: Three times a day (TID) | ORAL | Status: DC | PRN
  Administered 2022-12-17 – 2022-12-20 (×7): 0.5 mg via ORAL
  Filled 2022-12-17 (×7): qty 1

## 2022-12-17 NOTE — ED Provider Triage Note (Signed)
Emergency Medicine Provider Triage Evaluation Note  Diane May , a 47 y.o. female  was evaluated in triage.  Pt complains of continuing right eye problems.  Diagnosed with optic neuritis, was hospitalized 2/9-2/10.  Seen by ophthalmologist, was instructed to return to the ED for admission for IV antibiotics and to be seen/followed by neurology per patient.  No changes since last admission.  Review of Systems  Positive:  Negative: See above  Physical Exam  BP 127/78 (BP Location: Right Arm)   Pulse (!) 114   Temp 98.9 F (37.2 C)   Resp 16   LMP 11/21/2022 (Approximate)   SpO2 96%  Gen:   Awake, no distress   Resp:  Normal effort  MSK:   Moves extremities without difficulty  Other:  Gaze aligned appropriately.  Sitting comfortably.  Not diaphoretic.  Medical Decision Making  Medically screening exam initiated at 4:02 PM.  Appropriate orders placed.  Yacine Scearce Hessler was informed that the remainder of the evaluation will be completed by another provider, this initial triage assessment does not replace that evaluation, and the importance of remaining in the ED until their evaluation is complete.     Prince Rome, PA-C [id number]

## 2022-12-17 NOTE — Progress Notes (Signed)
[facility] Note  12/17/2022     CHIEF COMPLAINT Patient presents for Retina Evaluation   HISTORY OF PRESENT ILLNESS: Diane May is a 47 y.o. female who presents to the clinic today for:   HPI     Retina Evaluation   In right eye.  This started 3 days ago.  Duration of 3 days.  Context:  distance vision, mid-range vision and near vision.  I, the attending physician,  performed the HPI with the patient and updated documentation appropriately.        Comments   Retina eval per Dr Marin Comment pt lost her vision in the right on Saturday and was unable to see anything things looked black and a shadow pt reports when moving her eye she started having some pain she went to the ED and was tested for MS and test was negative she was given steroids and her vision started coming back on Sunday gradually she denies any flashes floaters or headache       Last edited by Bernarda Caffey, MD on 12/17/2022 11:18 AM.    Pt is here on the referral of Dr. Marin Comment for vision loss OD, pt saw her Friday morning and Dr. Marin Comment told her to go to the ED, pt went to [facility] and was transferred to [facility], pt was given 1-2 doses of IV steroids and states her vision is better today, pt states she has small spots of darkness in her vision, she states she had no color vision when it first happened, she could only see in black and white, she does not have a f/u appt with neurology, pt denies any medical problems except allergies, she denies any autoimmune diseases, but her sister has lupus, pt denies taking supplements or recreational drugs before this episode. She was seen by neurology and discharged on 12/14/22. Review of chart shows Neurology recommended no IV steroids and outpt Ophthalmology f/u.  Referring physician: Harlen Labs, MD [phone] [facility] [place],  [place] [address]  HISTORICAL INFORMATION:   Selected notes from the MEDICAL RECORD NUMBER Referred by Dr. Marin Comment for vision loss  OD LEE:  Ocular Hx- PMH-    CURRENT MEDICATIONS: No current outpatient medications on file. (Ophthalmic Drugs)   No current facility-administered medications for this visit. (Ophthalmic Drugs)   Current Outpatient Medications (Other)  Medication Sig   albuterol (VENTOLIN HFA) 108 (90 Base) MCG/ACT inhaler Inhale 2 puffs into the lungs every 4 (four) hours as needed for wheezing or shortness of breath.   citalopram (CELEXA) 40 MG tablet Take 40 mg by mouth daily.   clonazePAM (KLONOPIN) 0.5 MG tablet Take 0.5 mg by mouth 3 (three) times daily as needed for anxiety.   ibuprofen (ADVIL) 600 MG tablet Take 1 tablet (600 mg total) by mouth every 6 (six) hours as needed. (Patient not taking: Reported on 12/13/2022)   levocetirizine (XYZAL) 5 MG tablet Take 5 mg by mouth at bedtime.   oseltamivir (TAMIFLU) 75 MG capsule Take 1 capsule (75 mg total) by mouth every 12 (twelve) hours. (Patient not taking: Reported on 12/13/2022)   phentermine (ADIPEX-P) 37.5 MG tablet Take 37.5 mg by mouth every morning.   promethazine-dextromethorphan (PROMETHAZINE-DM) 6.25-15 MG/5ML syrup Take 5 mLs by mouth 4 (four) times daily as needed. (Patient not taking: Reported on 12/13/2022)   rosuvastatin (CRESTOR) 10 MG tablet Take 10 mg by mouth daily.   No current facility-administered medications for this visit. (Other)  REVIEW OF SYSTEMS: ROS   Positive for: Neurological, Eyes, Psychiatric Last edited by Bernarda Caffey, MD on 12/17/2022 11:03 AM.     ALLERGIES Allergies  Allergen Reactions   Penicillins Rash   PAST MEDICAL HISTORY Past Medical History:  Diagnosis Date   Allergy    Anxiety    Depression    Dyslipidemia (high LDL; low HDL)    Past Surgical History:  Procedure Laterality Date   BREAST SURGERY     cyst removal from L breast   CESAREAN SECTION     x3   CHOLECYSTECTOMY     FAMILY HISTORY Family History  Problem Relation Age of Onset   Cancer Mother    Cancer Father    Breast cancer  Sister        half sister    SOCIAL HISTORY Social History   Tobacco Use   Smoking status: Never   Smokeless tobacco: Never  Vaping Use   Vaping Use: Never used  Substance Use Topics   Alcohol use: No    Alcohol/week: 0.0 standard drinks of alcohol   Drug use: No       OPHTHALMIC EXAM:  Base Eye Exam     Visual Acuity (Snellen - Linear)       Right Left   Dist [place] CF at 3'    Dist cc  20/25 -2   Dist ph [place] 20/150 -2     Correction: Contacts  Cl in os        Tonometry (Tonopen, 8:29 AM)       Right Left   Pressure 24 26  squeezing        Pupils       Pupils Dark Light Shape React APD   Right PERRL 4 3 Round Brisk None   Left PERRL 3 2 Round Brisk None         Visual Fields       Left Right    Full    Restrictions  Total inferior temporal, inferior nasal deficiencies         Extraocular Movement       Right Left    Full, Ortho Full, Ortho         Neuro/Psych     Oriented x3: Yes   Mood/Affect: Normal         Dilation     Both eyes: 2.5% Phenylephrine @ 8:31 AM           Additional Tests     Color       Right Left   [place] 10/13 13/13           Slit Lamp and Fundus Exam     Slit Lamp Exam       Right Left   Lids/Lashes Normal Normal   Conjunctiva/Sclera White and quiet White and quiet   Cornea Arcus, mild sub epi scar IN midzone Arcus, mild sub epi scar IN midzone   Anterior Chamber deep and clear deep and clear   Iris Round and dilated Round and dilated   Lens 1-2+ Nuclear sclerosis, 1-2+ Cortical cataract 1-2+ Nuclear sclerosis, 1-2+ Cortical cataract   Anterior Vitreous mild syneresis mild syneresis         Fundus Exam       Right Left   Disc Pink, mild nasal elevation, mild blurring of margins; no heme Pink and Sharp, Compact   C/D Ratio 0.2 0.1   Macula Flat, Good foveal reflex, No heme or edema Flat,  Good foveal reflex, No heme or edema   Vessels mild tortuosity mild tortuosity, mild AV  crossing changes   Periphery Attached, No heme Attached            IMAGING AND PROCEDURES  Imaging and Procedures for 12/17/2022  OCT, Retina - OU - Both Eyes       Right Eye Quality was good. Central Foveal Thickness: 298. Progression has no prior data. Findings include normal foveal contour, no IRF, no SRF, vitreomacular adhesion .   Left Eye Quality was good. Central Foveal Thickness: 301. Progression has no prior data. Findings include normal foveal contour, no IRF, no SRF, vitreomacular adhesion .   Notes *Images captured and stored on drive  Diagnosis / Impression:  NFP, no IRF/SRF OU  Clinical management:  See below  Abbreviations: NFP - Normal foveal profile. CME - cystoid macular edema. PED - pigment epithelial detachment. IRF - intraretinal fluid. SRF - subretinal fluid. EZ - ellipsoid zone. ERM - epiretinal membrane. ORA - outer retinal atrophy. ORT - outer retinal tubulation. SRHM - subretinal hyper-reflective material. IRHM - intraretinal hyper-reflective material      Fluorescein Angiography Optos (Transit OD)       Right Eye Progression has no prior data. Early phase findings include normal observations. Mid/Late phase findings include leakage, staining (Hyper fluorescence of the disc).   Left Eye Progression has no prior data. Early phase findings include normal observations. Mid/Late phase findings include normal observations (No leakage or staining).   Notes **Images stored on drive**  Impression: OD: Hyper fluorescence of the disc OS: normal study, no leakage or staining            ASSESSMENT/PLAN:    ICD-10-CM   1. 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 Marin Comment, Optometry who  directed pt to ED for further evaluation - Pt presented to [facility] ED on 2.9.24, transferred to [facility] ED where MRI showed no demyelinating lesions on brain or spinal cord - pt received 1 dose of IV solumedrol, pt was seen by Neurology and discharged with recommendation of outpt ophthalmology f/u, no IV steroids - today, pt presents with subjective improvement in vision, but still blurry, +pain w/ eye movements - dilated eye exam shows BCVA 20/150 OD, decreased color vision, mild elevation of optic w/ blurring of margins - fluorescein angiogram 2.13.24 shows hyperfluorescence of disc OD consistent with inflammation / optic neuritis - discussed case with Neurologist on call at [facility], Dr. Leonel Ramsay - recommend completion of steroid regimen for optic neuritis Methylprednisolone 1 g/day i.v. for 3 days, then Prednisone 1 mg/kg/day p.o. for 11 days, then Taper prednisone over 4 days (20 mg on day 1, 10 mg on days 2 through 4). Antiulcer medication (e.g., omeprazole 20 mg p.o. daily or ranitidine 150 mg p.o. b.i.d.) for gastric prophylaxis. - recommend review of MRI brain with Radiology for enhancement of R optic nerve vs checking MRI orbits for improved resolution of optic nerve enhancement - Dr. Leonel Ramsay recommended directing pt to ED for readmission  - f/u 2-3 weeks here  2. Mixed Cataract OU - The symptoms of cataract, surgical options, and treatments and risks were discussed with patient. - discussed  diagnosis and progression - monitor  Ophthalmic Meds Ordered this visit:  No orders of the defined types were placed in this encounter.    Return for f/u 2-3 weeks, optic neuritis OD, DFE, OCT.  There are no Patient Instructions on file for this visit.   Explained the diagnoses, plan, and follow up with the patient and they expressed understanding.  Patient expressed understanding of the importance of proper follow up care.   This document serves as a record of services personally  performed by Gardiner Sleeper, MD, PhD. It was created on their behalf by San Jetty. Owens Shark, OA an ophthalmic technician. The creation of this record is the provider's dictation and/or activities during the visit.    Electronically signed by: San Jetty. Owens Shark, [place] 02.13.2024 11:18 AM  Gardiner Sleeper, M.D., Ph.D. Diseases & Surgery of the Retina and Vitreous [facility]  I have reviewed the above documentation for accuracy and completeness, and I agree with the above. Gardiner Sleeper, M.D., Ph.D. 12/17/22 11:36 AM   Abbreviations: M myopia (nearsighted); A astigmatism; H hyperopia (farsighted); P presbyopia; Mrx spectacle prescription;  CTL contact lenses; OD right eye; OS left eye; OU both eyes  XT exotropia; ET esotropia; PEK punctate epithelial keratitis; PEE punctate epithelial erosions; DES dry eye syndrome; MGD meibomian gland dysfunction; ATs artificial tears; PFAT's preservative free artificial tears; [place] nuclear sclerotic cataract; PSC posterior subcapsular cataract; ERM epi-retinal membrane; PVD posterior vitreous detachment; RD retinal detachment; DM diabetes mellitus; DR diabetic retinopathy; NPDR non-proliferative diabetic retinopathy; PDR proliferative diabetic retinopathy; CSME clinically significant macular edema; DME diabetic macular edema; dbh dot blot hemorrhages; CWS cotton wool spot; POAG primary open angle glaucoma; C/D cup-to-disc ratio; HVF humphrey visual field; GVF goldmann visual field; OCT optical coherence tomography; IOP intraocular pressure; BRVO Branch retinal vein occlusion; CRVO central retinal vein occlusion; CRAO central retinal artery occlusion; BRAO branch retinal artery occlusion; RT retinal tear; SB scleral buckle; PPV pars plana vitrectomy; VH Vitreous hemorrhage; PRP panretinal laser photocoagulation; IVK intravitreal kenalog; VMT vitreomacular traction; MH Macular hole;  NVD neovascularization of the disc; NVE neovascularization elsewhere;  AREDS age related eye disease study; ARMD age related macular degeneration; POAG primary open angle glaucoma; EBMD epithelial/anterior basement membrane dystrophy; ACIOL anterior chamber intraocular lens; IOL intraocular lens; PCIOL posterior chamber intraocular lens; Phaco/IOL phacoemulsification with intraocular lens placement; [place] photorefractive keratectomy; LASIK laser assisted in situ keratomileusis; HTN hypertension; DM diabetes mellitus; COPD chronic obstructive pulmonary disease

## 2022-12-17 NOTE — ED Provider Notes (Signed)
[facility] Provider Note   CSN: [id number] Arrival date & time: 12/17/22  1520     History  Chief Complaint  Patient presents with   Eye Problem    Diane May is a 47 y.o. female.  Pt is a 47 yo female with pmhx significant for depression, hld, and anxiety.  She was admitted from 2/9-2/10 for vision changes with possible optic neuritis.  MRI neg, so she was d/c home with ophthalmology f/u. She was given 1 dose of solumedrol IV and d/c home without steroids.  Pt said she could not see when she was d/c.  She did see Dr. Coralyn Pear (ophthalmology) today who put a note in epic and spoke with Dr. Leonel Ramsay as she has optic neuritis.  Dr. Leonel Ramsay spoke with me and recommended admission for IV steroids.  Pt said her vision is a little better, but she still has spots in her right eye.       Home Medications Prior to Admission medications   Medication Sig Start Date End Date Taking? Authorizing Provider  albuterol (VENTOLIN HFA) 108 (90 Base) MCG/ACT inhaler Inhale 2 puffs into the lungs every 4 (four) hours as needed for wheezing or shortness of breath. 01/04/21  Yes Avegno, Darrelyn Hillock, FNP  citalopram (CELEXA) 40 MG tablet Take 40 mg by mouth daily. 12/05/22  Yes [provider]  clonazePAM (KLONOPIN) 0.5 MG tablet Take 0.5 mg by mouth 3 (three) times daily as needed for anxiety. 06/30/22  Yes [provider]  levocetirizine (XYZAL) 5 MG tablet Take 5 mg by mouth at bedtime. 11/12/22  Yes [provider]  phentermine (ADIPEX-P) 37.5 MG tablet Take 37.5 mg by mouth every morning. 12/05/22  Yes [provider]  rosuvastatin (CRESTOR) 10 MG tablet Take 10 mg by mouth daily.   Yes [provider]  ibuprofen (ADVIL) 600 MG tablet Take 1 tablet (600 mg total) by mouth every 6 (six) hours as needed. Patient not taking: Reported on 12/13/2022 04/15/21   Melynda Ripple, MD  oseltamivir (TAMIFLU) 75 MG  capsule Take 1 capsule (75 mg total) by mouth every 12 (twelve) hours. Patient not taking: Reported on 12/13/2022 09/25/22   Volney American, PA-C  promethazine-dextromethorphan (PROMETHAZINE-DM) 6.25-15 MG/5ML syrup Take 5 mLs by mouth 4 (four) times daily as needed. Patient not taking: Reported on 12/13/2022 09/25/22   Volney American, PA-C      Allergies    Penicillins    Review of Systems   Review of Systems  Eyes:  Positive for visual disturbance.  All other systems reviewed and are negative.   Physical Exam Updated Vital Signs BP (!) 126/90 (BP Location: Right Arm)   Pulse 81   Temp 97.9 F (36.6 C) (Oral)   Resp 18   Ht 5' 4"$  (1.626 m)   Wt 79.4 kg   LMP 11/21/2022 (Approximate)   SpO2 98%   BMI 30.04 kg/m  Physical Exam Vitals and nursing note reviewed.  Constitutional:      Appearance: Normal appearance.  HENT:     Head: Normocephalic and atraumatic.     Right Ear: External ear normal.     Left Ear: External ear normal.     Nose: Nose normal.     Mouth/Throat:     Mouth: Mucous membranes are moist.     Pharynx: Oropharynx is clear.  Eyes:     Extraocular Movements: Extraocular movements intact.     Pupils: Pupils are  equal, round, and reactive to light.  Cardiovascular:     Rate and Rhythm: Normal rate and regular rhythm.     Pulses: Normal pulses.     Heart sounds: Normal heart sounds.  Pulmonary:     Effort: Pulmonary effort is normal.     Breath sounds: Normal breath sounds.  Abdominal:     General: Abdomen is flat. Bowel sounds are normal.     Palpations: Abdomen is soft.  Musculoskeletal:        General: Normal range of motion.     Cervical back: Normal range of motion and neck supple.  Skin:    General: Skin is warm.     Capillary Refill: Capillary refill takes less than 2 seconds.  Neurological:     General: No focal deficit present.     Mental Status: She is alert and oriented to person, place, and time.  Psychiatric:         Mood and Affect: Mood normal.        Behavior: Behavior normal.     ED Results / Procedures / Treatments   Labs (all labs ordered are listed, but only abnormal results are displayed) Labs Reviewed  BASIC METABOLIC PANEL - Abnormal; Notable for the following components:      Result Value   Potassium 3.4 (*)    Glucose, Bld 115 (*)    All other components within normal limits  CBC WITH DIFFERENTIAL/PLATELET - Abnormal; Notable for the following components:   WBC 11.4 (*)    All other components within normal limits    EKG None  Radiology Fluorescein Angiography Optos (Transit OD)  Result Date: 12/17/2022 Right Eye Progression has no prior data. Early phase findings include normal observations. Mid/Late phase findings include leakage, staining (Hyper fluorescence of the disc). Left Eye Progression has no prior data. Early phase findings include normal observations. Mid/Late phase findings include normal observations (No leakage or staining). Notes **Images stored on drive** Impression: OD: Hyper fluorescence of the disc OS: normal study, no leakage or staining   OCT, Retina - OU - Both Eyes  Result Date: 12/17/2022 Right Eye Quality was good. Central Foveal Thickness: 298. Progression has no prior data. Findings include normal foveal contour, no IRF, no SRF, vitreomacular adhesion . Left Eye Quality was good. Central Foveal Thickness: 301. Progression has no prior data. Findings include normal foveal contour, no IRF, no SRF, vitreomacular adhesion . Notes *Images captured and stored on drive Diagnosis / Impression: NFP, no IRF/SRF OU Clinical management: See below Abbreviations: NFP - Normal foveal profile. CME - cystoid macular edema. PED - pigment epithelial detachment. IRF - intraretinal fluid. SRF - subretinal fluid. EZ - ellipsoid zone. ERM - epiretinal membrane. ORA - outer retinal atrophy. ORT - outer retinal tubulation. SRHM - subretinal hyper-reflective material. IRHM -  intraretinal hyper-reflective material    Procedures Procedures    Medications Ordered in ED Medications  methylPREDNISolone sodium succinate (SOLU-MEDROL) 1,000 mg in sodium chloride 0.9 % 50 mL IVPB (has no administration in time range)    ED Course/ Medical Decision Making/ A&P                             Medical Decision Making Risk Decision regarding hospitalization.   This patient presents to the ED for concern of vision changes, this involves an extensive number of treatment options, and is a complaint that carries with it a high risk of complications and  morbidity.  The differential diagnosis includes optic neuritis   Co morbidities that complicate the patient evaluation  depression, hld, and anxiety   Additional history obtained:  Additional history obtained from epic chart review   Lab Tests:  I Ordered, and personally interpreted labs.  The pertinent results include:  cbc and bmp nl  Cardiac Monitoring:  The patient was maintained on a cardiac monitor.  I personally viewed and interpreted the cardiac monitored which showed an underlying rhythm of: nsr   Medicines ordered and prescription drug management:  I ordered medication including IV solumedrol  for optic neuritis  Reevaluation of the patient after these medicines showed that the patient stayed the same I have reviewed the patients home medicines and have made adjustments as needed   Critical Interventions:  Neuro consult   Consultations Obtained:  I requested consultation with the neurologist,  and discussed lab and imaging findings as well as pertinent plan - they recommend: admission for IV steroids. Pt d/w Dr. Myna Hidalgo ([facility]) for admission.   Problem List / ED Course:  Optic Neuritis:  admission for IV steroids.    Reevaluation:  After the interventions noted above, I reevaluated the patient and found that they have :stayed the same   Social Determinants of Health:  Lives at  home   Dispostion:  After consideration of the diagnostic results and the patients response to treatment, I feel that the patent would benefit from admission.          Final Clinical Impression(s) / ED Diagnoses Final diagnoses:  Optic neuritis    Rx / DC Orders ED Discharge Orders     None         Isla Pence, MD 12/17/22 [phone]

## 2022-12-17 NOTE — ED Triage Notes (Addendum)
Pt presents with vision trouble in right eye since Saturday. Pt states she was seen at [facility] for the same a couple days ago but her eye doctor said that she needed IV steroids due to inflammation persisting.

## 2022-12-17 NOTE — H&P (Signed)
History and Physical    Diane May DOB: [birth year] DOA: 12/17/2022  PCP: Medicine, [facility]   Patient coming from: Home   Chief Complaint: Blurred vision in right eye and pain with eye movement   HPI: Diane May is a pleasant 47 y.o. female with medical history significant for anxiety and hyperlipidemia presents emergency department with blurred vision and pain with movement of the right eye.   Patient reports waking the morning of 12/13/2022 with almost complete vision loss involving her right eye, as well as pain with movement of the eye.  She saw ophthalmology that same day for evaluation of these complaints, there was suspicion for optic neuritis, and she was sent to [facility] emergency department.  She was admitted to the hospital and given 1 g of IV Solu-Medrol, did not have any evidence for CVA or demyelination on MRI, was seen by neurology, and was discharged home with recommendation for ophthalmology follow-up.    She began to experience some improvement in her vision the day after her discharge, but continues to have blurred vision in the right eye and pain with eye movement.  She saw ophthalmology again today where she had dilated exam concerning for ongoing optic neuritis.  Ophthalmology discussed the case with inpatient neurologist at [facility] who agreed with hospital admission for high-dose steroids.   She denies headache or focal numbness or weakness.  She denies fevers.  ED Course: Upon arrival to the ED, patient is found to be afebrile and saturating well on room air with stable blood pressure.  Labs notable for WBC 11,400 and potassium 3.4.  Neurology was consulted by the ED physician and the patient was given 1000 mg of IV Solu-Medrol.  Review of Systems:  All other systems reviewed and apart from HPI, are negative.  Past Medical History:  Diagnosis Date   Allergy    Anxiety    Depression    Dyslipidemia (high LDL; low HDL)      Past Surgical History:  Procedure Laterality Date   BREAST SURGERY     cyst removal from L breast   CESAREAN SECTION     x3   CHOLECYSTECTOMY      Social History:   reports that she has never smoked. She has never used smokeless tobacco. She reports that she does not drink alcohol and does not use drugs.  Allergies  Allergen Reactions   Penicillins Rash    Family History  Problem Relation Age of Onset   Cancer Mother    Cancer Father    Breast cancer Sister        half sister      Prior to Admission medications   Medication Sig Start Date End Date Taking? Authorizing Provider  albuterol (VENTOLIN HFA) 108 (90 Base) MCG/ACT inhaler Inhale 2 puffs into the lungs every 4 (four) hours as needed for wheezing or shortness of breath. 01/04/21  Yes Avegno, Darrelyn Hillock, FNP  citalopram (CELEXA) 40 MG tablet Take 40 mg by mouth daily. 12/05/22  Yes [provider]  clonazePAM (KLONOPIN) 0.5 MG tablet Take 0.5 mg by mouth 3 (three) times daily as needed for anxiety. 06/30/22  Yes [provider]  levocetirizine (XYZAL) 5 MG tablet Take 5 mg by mouth at bedtime. 11/12/22  Yes [provider]  phentermine (ADIPEX-P) 37.5 MG tablet Take 37.5 mg by mouth every morning. 12/05/22  Yes [provider]  rosuvastatin (CRESTOR) 10 MG tablet Take 10 mg by mouth daily.  Yes [provider]    Physical Exam: Vitals:   12/17/22 1547 12/17/22 1605 12/17/22 1922  BP: 127/78  (!) 126/90  Pulse: (!) 114  81  Resp: 16  18  Temp: 98.9 F (37.2 C)  97.9 F (36.6 C)  TempSrc:   Oral  SpO2: 96%  98%  Weight:  79.4 kg   Height:  5' 4"$  (1.626 m)     Constitutional: NAD, calm  Eyes: PERTLA, lids and conjunctivae normal ENMT: Mucous membranes are moist. Posterior pharynx clear of any exudate or lesions.   Neck: supple, no masses  Respiratory:  no wheezing, no crackles. No accessory muscle use.  Cardiovascular: S1 & S2 heard, regular rate and rhythm. No  extremity edema.   Abdomen: No distension, no tenderness, soft. Bowel sounds active.  Musculoskeletal: no clubbing / cyanosis. No joint deformity upper and lower extremities.   Skin: no significant rashes, lesions, ulcers. Warm, dry, well-perfused. Neurologic: No facial asymmetry. No aphasia or dysarthria. Sensation intact. Strength 5/5 in all 4 limbs. Alert and oriented.  Psychiatric: Pleasant. Cooperative.    Labs and Imaging on Admission: I have personally reviewed following labs and imaging studies  CBC: Recent Labs  Lab 12/13/22 1913 12/14/22 0248 12/17/22 1612  WBC 9.6 7.9 11.4*  NEUTROABS 6.2  --  6.9  HGB 15.3* 14.4 14.7  HCT 45.5 41.2 43.8  MCV 86.3 85.7 87.1  PLT 319 296 [id number]   Basic Metabolic Panel: Recent Labs  Lab 12/13/22 1913 12/14/22 0248 12/17/22 1612  NA 135 135 136  K 3.3* 3.7 3.4*  CL 99 103 101  CO2 24 22 25  $ GLUCOSE 100* 174* 115*  BUN 12 13 16  $ CREATININE 0.86 0.85 0.85  CALCIUM 9.4 9.3 9.2   GFR: Estimated Creatinine Clearance: 82.5 mL/min (by C-G formula based on SCr of 0.85 mg/dL). Liver Function Tests: Recent Labs  Lab 12/13/22 1913 12/14/22 0248  AST 20 18  ALT 31 27  ALKPHOS 134* 110  BILITOT 1.4* 1.1  PROT 8.8* 7.0  ALBUMIN 4.5 3.5   No results for input(s): "LIPASE", "AMYLASE" in the last 168 hours. No results for input(s): "AMMONIA" in the last 168 hours. Coagulation Profile: Recent Labs  Lab 12/13/22 1913  INR 1.0   Cardiac Enzymes: No results for input(s): "CKTOTAL", "CKMB", "CKMBINDEX", "TROPONINI" in the last 168 hours. BNP (last 3 results) No results for input(s): "PROBNP" in the last 8760 hours. HbA1C: No results for input(s): "HGBA1C" in the last 72 hours. CBG: No results for input(s): "GLUCAP" in the last 168 hours. Lipid Profile: No results for input(s): "CHOL", "HDL", "LDLCALC", "TRIG", "CHOLHDL", "LDLDIRECT" in the last 72 hours. Thyroid Function Tests: No results for input(s): "TSH", "T4TOTAL", "FREET4",  "T3FREE", "THYROIDAB" in the last 72 hours. Anemia Panel: No results for input(s): "VITAMINB12", "FOLATE", "FERRITIN", "TIBC", "IRON", "RETICCTPCT" in the last 72 hours. Urine analysis:    Component Value Date/Time   COLORURINE YELLOW 12/13/2022 1900   APPEARANCEUR CLEAR 12/13/2022 1900   LABSPEC 1.011 12/13/2022 1900   PHURINE 5.0 12/13/2022 1900   GLUCOSEU NEGATIVE 12/13/2022 1900   HGBUR NEGATIVE 12/13/2022 1900   BILIRUBINUR NEGATIVE 12/13/2022 1900   BILIRUBINUR negative 01/25/2022 1729   BILIRUBINUR n 12/09/2019 1618   KETONESUR NEGATIVE 12/13/2022 1900   PROTEINUR NEGATIVE 12/13/2022 1900   UROBILINOGEN 0.2 01/25/2022 1729   NITRITE NEGATIVE 12/13/2022 1900   LEUKOCYTESUR NEGATIVE 12/13/2022 1900   Sepsis Labs: @LABRCNTIP$ (procalcitonin:4,lacticidven:4) )No results found for this or any previous visit (from the  past 240 hour(s)).   Radiological Exams on Admission: Fluorescein Angiography Optos (Transit OD)  Result Date: 12/17/2022 Right Eye Progression has no prior data. Early phase findings include normal observations. Mid/Late phase findings include leakage, staining (Hyper fluorescence of the disc). Left Eye Progression has no prior data. Early phase findings include normal observations. Mid/Late phase findings include normal observations (No leakage or staining). Notes **Images stored on drive** Impression: OD: Hyper fluorescence of the disc OS: normal study, no leakage or staining   OCT, Retina - OU - Both Eyes  Result Date: 12/17/2022 Right Eye Quality was good. Central Foveal Thickness: 298. Progression has no prior data. Findings include normal foveal contour, no IRF, no SRF, vitreomacular adhesion . Left Eye Quality was good. Central Foveal Thickness: 301. Progression has no prior data. Findings include normal foveal contour, no IRF, no SRF, vitreomacular adhesion . Notes *Images captured and stored on drive Diagnosis / Impression: NFP, no IRF/SRF OU Clinical  management: See below Abbreviations: NFP - Normal foveal profile. CME - cystoid macular edema. PED - pigment epithelial detachment. IRF - intraretinal fluid. SRF - subretinal fluid. EZ - ellipsoid zone. ERM - epiretinal membrane. ORA - outer retinal atrophy. ORT - outer retinal tubulation. SRHM - subretinal hyper-reflective material. IRHM - intraretinal hyper-reflective material    Assessment/Plan  1. Optic neuritis  - Appreciate neurology consultation, planning for 1000 mg IV Solu-Medrol qD x5 doses, PLEX to be considered if no improvement with steroids, check serum NMO panel and MOB Ab, use PPI while on steroids    2. Anxiety  - Continue Celexa and as-needed Klonopin    3. Hyperlipidemia  - Continue Crestor    4. Hypokalemia  - Supplement potassium     DVT prophylaxis: Lovenox  Code Status: Full  Level of Care: Level of care: Med-Surg Family Communication: None present  Disposition Plan:  Patient is from: Home  Anticipated d/c is to: Home  Anticipated d/c date is: 12/21/22  Patient currently: Pending improvement with high-dose steroids  Consults called: neurology  Admission status: Inpatient     Vianne Bulls, MD [facility]  12/17/2022, 7:38 PM

## 2022-12-17 NOTE — Consult Note (Addendum)
NEUROLOGY CONSULTATION NOTE   Date of service: December 17, 2022 Patient Name: Diane May MRN:  [id number] DOB:  [birth year] Reason for consult: "optic neuritis" Requesting Provider: Vianne Bulls, MD _ _ _   _ __   _ __ _ _  __ __   _ __   __ _  History of Present Illness  Diane May is a 47 y.o. female with PMH significant for HLD, anxiety, depression who presents with painful right eye vision loss.  Reports that on 12 December 2022, he was talking to her son about noticing some black spot in her right vision.  She went to bed and woke up on the 12/13/2022 and was unable to see out of her right eye.  She set up an appointment with optometrist and was directed to the ED by optometrist for concern for optic neuritis.  She went to [facility] and was transferred to [facility] for further evaluation and workup.  She had workup with MRI of her brain, cervical and thoracic spine with and without contrast which came back completely normal.  She was given 1 dose of IV Solu-Medrol and discharged home.  She followed up with ophthalmology who did fluorescein angiography with concern about optic disc edema and optic neuritis.  She was directed back to the ED.  She denies any family history of MS.  She reports that she has a biological twin sister who has been diagnosed with lupus.  She denies any other history of neurological illness.  She reports that when she was given steroids on the ninth, her vision was better and she could see a little out of her right eye.  She reports that over the last couple days, her vision has again deteriorated.   ROS   Constitutional Denies weight loss, fever and chills.   HEENT Denies changes in vision and hearing.   Respiratory Denies SOB and cough.   CV Denies palpitations and CP   GI Denies abdominal pain, nausea, vomiting and diarrhea.   GU Denies dysuria and urinary frequency.   MSK Denies myalgia and joint pain.   Skin Denies rash and pruritus.    Neurological Denies headache and syncope.   Psychiatric Denies recent changes in mood. Denies anxiety and depression.    Past History   Past Medical History:  Diagnosis Date   Allergy    Anxiety    Depression    Dyslipidemia (high LDL; low HDL)    Past Surgical History:  Procedure Laterality Date   BREAST SURGERY     cyst removal from L breast   CESAREAN SECTION     x3   CHOLECYSTECTOMY     Family History  Problem Relation Age of Onset   Cancer Mother    Cancer Father    Breast cancer Sister        half sister    Social History   Socioeconomic History   Marital status: Married    Spouse name: Not on file   Number of children: Not on file   Years of education: Not on file   Highest education level: Not on file  Occupational History   Not on file  Tobacco Use   Smoking status: Never   Smokeless tobacco: Never  Vaping Use   Vaping Use: Never used  Substance and Sexual Activity   Alcohol use: No    Alcohol/week: 0.0 standard drinks of alcohol   Drug use: No   Sexual activity: Yes  Birth control/protection: Abstinence  Other Topics Concern   Not on file  Social History Narrative   Education: Other. Married. Exercise: 7 days a week   Social Determinants of Health   Financial Resource Strain: Not on file  Food Insecurity: No Food Insecurity (12/13/2022)   Hunger Vital Sign    Worried About Running Out of Food in the Last Year: Never true    Ran Out of Food in the Last Year: Never true  Transportation Needs: No Transportation Needs (12/13/2022)   PRAPARE - Hydrologist (Medical): No    Lack of Transportation (Non-Medical): No  Physical Activity: Not on file  Stress: Not on file  Social Connections: Not on file   Allergies  Allergen Reactions   Penicillins Rash    Medications  (Not in a hospital admission)    Vitals   Vitals:   12/17/22 1547 12/17/22 1605 12/17/22 1922  BP: 127/78  (!) 126/90  Pulse: (!) 114  81   Resp: 16  18  Temp: 98.9 F (37.2 C)  97.9 F (36.6 C)  TempSrc:   Oral  SpO2: 96%  98%  Weight:  79.4 kg   Height:  5' 4"$  (1.626 m)      Body mass index is 30.04 kg/m.  Physical Exam   General: Laying comfortably in bed; in no acute distress.  HENT: Normal oropharynx and mucosa. Normal external appearance of ears and nose.  Neck: Supple, no pain or tenderness  CV: No JVD. No peripheral edema.  Pulmonary: Symmetric Chest rise. Normal respiratory effort.  Abdomen: Soft to touch, non-tender.  Ext: No cyanosis, edema, or deformity  Skin: No rash. Normal palpation of skin.   Musculoskeletal: Normal digits and nails by inspection. No clubbing.   Neurologic Examination  Mental status/Cognition: Alert, oriented to self, place, month and year, good attention.  Speech/language: Fluent, comprehension intact, object naming intact, repetition intact.  Cranial nerves:   CN II Pupils equal and reactive to light, R eye 20/200, L eye 20/25.   CN III,IV,VI EOM intact, no gaze preference or deviation, no nystagmus    CN V normal sensation in V1, V2, and V3 segments bilaterally    CN VII no asymmetry, no nasolabial fold flattening    CN VIII normal hearing to speech    CN IX & X normal palatal elevation, no uvular deviation    CN XI 5/5 head turn and 5/5 shoulder shrug bilaterally    CN XII midline tongue protrusion    Motor:  Muscle bulk: normal, tone normal, pronator drift none tremor none Mvmt Root Nerve  Muscle Right Left Comments  SA C5/6 Ax Deltoid 5 5   EF C5/6 Mc Biceps 5 5   EE C6/7/8 Rad Triceps 5 5   WF C6/7 Med FCR     WE C7/8 PIN ECU     F Ab C8/T1 U ADM/FDI 5 5   HF L1/2/3 Fem Illopsoas 5 5   KE L2/3/4 Fem Quad 5 5   DF L4/5 D Peron Tib Ant 5 5   PF S1/2 Tibial Grc/Sol 5 5    Sensation:  Light touch Intact throughout   Pin prick    Temperature    Vibration   Proprioception    Coordination/Complex Motor:  - Finger to Nose intact bilaterally - Rapid alternating  movement are normal - Gait: Deferred for patient's safety. Labs   CBC:  Recent Labs  Lab 12/13/22 1913 12/14/22 0248 12/17/22 1612  WBC 9.6 7.9 11.4*  NEUTROABS 6.2  --  6.9  HGB 15.3* 14.4 14.7  HCT 45.5 41.2 43.8  MCV 86.3 85.7 87.1  PLT 319 296 [id number]    Basic Metabolic Panel:  Lab Results  Component Value Date   NA 136 12/17/2022   K 3.4 (L) 12/17/2022   CO2 25 12/17/2022   GLUCOSE 115 (H) 12/17/2022   BUN 16 12/17/2022   CREATININE 0.85 12/17/2022   CALCIUM 9.2 12/17/2022   GFRNONAA >60 12/17/2022   GFRAA 93 08/02/2019   Lipid Panel:  Lab Results  Component Value Date   LDLCALC 156 (H) 08/02/2019   HgbA1c: No results found for: "HGBA1C" Urine Drug Screen:     Component Value Date/Time   LABOPIA NONE DETECTED 12/13/2022 1900   COCAINSCRNUR NONE DETECTED 12/13/2022 1900   LABBENZ NONE DETECTED 12/13/2022 1900   AMPHETMU NONE DETECTED 12/13/2022 1900   THCU NONE DETECTED 12/13/2022 1900   LABBARB NONE DETECTED 12/13/2022 1900    Alcohol Level     Component Value Date/Time   ETH <10 12/13/2022 1913    CT Head without contrast(Personally reviewed): CTH was negative for a large hypodensity concerning for a large territory infarct or hyperdensity concerning for an ICH  MRI Brain, C, T spine w + w/o C from 12/14/22.(Personally reviewed): No T2/FLAIR/STIR hyperintensities, no contrast enhancement.  Impression   Diane May is a 47 y.o. female with PMH significant for HLD, anxiety, depression who presents with painful right eye vision loss. Diagnosed with OD optic neuritis and directed to the ED.  She does not fit the revised Mcdonald criteria for MS at this time. Other differential includes NMOSD vs MOGAD. Unclear if the optic neuritis is monophasic vs recurrent and may develop other lesions too.  Recommendations  - IV solumedrol 1028m daily for a total of 5 doses. She already had 1 dose today, will do 4 additional doses with an end date of  2/17. - PPI while on Steroids. - Serum NMO panel along with MOG Ab. - if no significant improvement after 3-4 days of IVMP, may consider PLEX. - [facility] - we will continue to follow along. ______________________________________________________________________  Plan discussed with patient at bedside and with Dr. OMyna Hidalgoover secure chat.  Thank you for the opportunity to take part in the care of this patient. If you have any further questions, please contact the neurology consultation attending.  Signed,  SPortolaPager Number [phone]_ _ _   _ __   _ __ _ _  __ __   _ __   __ _

## 2022-12-18 DIAGNOSIS — H469 Unspecified optic neuritis: Secondary | ICD-10-CM | POA: Diagnosis not present

## 2022-12-18 DIAGNOSIS — R739 Hyperglycemia, unspecified: Secondary | ICD-10-CM

## 2022-12-18 DIAGNOSIS — T380X5A Adverse effect of glucocorticoids and synthetic analogues, initial encounter: Secondary | ICD-10-CM

## 2022-12-18 LAB — BASIC METABOLIC PANEL
Anion gap: 14 (ref 5–15)
BUN: 14 mg/dL (ref 6–20)
CO2: 19 mmol/L — ABNORMAL LOW (ref 22–32)
Calcium: 9 mg/dL (ref 8.9–10.3)
Chloride: 99 mmol/L (ref 98–111)
Creatinine, Ser: 1.02 mg/dL — ABNORMAL HIGH (ref 0.44–1.00)
GFR, Estimated: >60 mL/min (ref >60)
Glucose, Bld: 251 mg/dL — ABNORMAL HIGH (ref 70–99)
Potassium: 4.7 mmol/L (ref 3.5–5.1)
Sodium: 132 mmol/L — ABNORMAL LOW (ref 135–145)

## 2022-12-18 LAB — CBC
HCT: 41.6 % (ref 36.0–46.0)
Hemoglobin: 14.3 g/dL (ref 12.0–15.0)
MCH: 29.3 pg (ref 26.0–34.0)
MCHC: 34.4 g/dL (ref 30.0–36.0)
MCV: 85.2 fL (ref 80.0–100.0)
Platelets: 301 10*3/uL (ref 150–400)
RBC: 4.88 MIL/uL (ref 3.87–5.11)
RDW: 13.9 % (ref 11.5–15.5)
WBC: 9.7 10*3/uL (ref 4.0–10.5)
nRBC: 0 % (ref 0.0–0.2)

## 2022-12-18 LAB — GLUCOSE, CAPILLARY
Glucose-Capillary: 239 mg/dL — ABNORMAL HIGH (ref 70–99)
Glucose-Capillary: 270 mg/dL — ABNORMAL HIGH (ref 70–99)
Glucose-Capillary: 297 mg/dL — ABNORMAL HIGH (ref 70–99)

## 2022-12-18 LAB — MAGNESIUM: Magnesium: 2.1 mg/dL (ref 1.7–2.4)

## 2022-12-18 LAB — CBG MONITORING, ED: Glucose-Capillary: 280 mg/dL — ABNORMAL HIGH (ref 70–99)

## 2022-12-18 LAB — HEMOGLOBIN A1C
Hgb A1c MFr Bld: 5.5 % (ref 4.8–5.6)
Mean Plasma Glucose: 111.15 mg/dL

## 2022-12-18 MED ORDER — INSULIN ASPART 100 UNIT/ML IJ SOLN
0.0000 [IU] | Freq: Three times a day (TID) | INTRAMUSCULAR | Status: DC
  Administered 2022-12-18: 8 [IU] via SUBCUTANEOUS
  Administered 2022-12-18: 5 [IU] via SUBCUTANEOUS
  Administered 2022-12-18: 8 [IU] via SUBCUTANEOUS
  Administered 2022-12-19: 5 [IU] via SUBCUTANEOUS
  Administered 2022-12-19 (×2): 8 [IU] via SUBCUTANEOUS
  Administered 2022-12-20: 5 [IU] via SUBCUTANEOUS
  Administered 2022-12-20: 3 [IU] via SUBCUTANEOUS
  Administered 2022-12-20: 8 [IU] via SUBCUTANEOUS
  Administered 2022-12-21: 5 [IU] via SUBCUTANEOUS
  Administered 2022-12-21: 3 [IU] via SUBCUTANEOUS

## 2022-12-18 MED ORDER — ORAL CARE MOUTH RINSE: 15.0000 mL | OROMUCOSAL | Status: DC | PRN

## 2022-12-18 MED ORDER — INSULIN ASPART 100 UNIT/ML IJ SOLN
0.0000 [IU] | Freq: Every day | INTRAMUSCULAR | Status: DC
  Administered 2022-12-19: 3 [IU] via SUBCUTANEOUS
  Administered 2022-12-20: 4 [IU] via SUBCUTANEOUS

## 2022-12-18 NOTE — ED Notes (Signed)
Ambulating to bathroom, steady gait noted.

## 2022-12-18 NOTE — Assessment & Plan Note (Signed)
Continue Crestor 

## 2022-12-18 NOTE — Progress Notes (Signed)
Progress Note    Diane May   [id number]  DOB: 1976/06/07  DOA: 12/17/2022     1 PCP: Medicine, [facility]  Initial CC: Decreased vision right eye, painful right eye movements  Hospital Course: Diane May is a 47 yo female with PMH anxiety and hyperlipidemia who presented to the ER with complaints of blurry vision and pain with right eye movement.  She endorsed symptoms beginning around 12/13/2022 associated with almost complete vision loss that day and painful eye movements.  She had been seen in the ER and was treated with Solu-Medrol and discharged for outpatient follow-up.  She was seen by ophthalmology and recommended for returning to the ER for admission and ongoing high-dose steroids due to concern for optic neuritis. She was evaluated by neurology on admission as well and started on 5-day course of 1 g Solu-Medrol daily.  Interval History:  Seen in the ER this morning.  Endorses improvement in her right eye in terms of vision and pain.  Still states that she sees dots occasionally. No other neurologic symptoms.  Assessment and Plan: * Optic neuritis - Evaluated outpatient by ophthalmology with concern for optic neuritis and referred to the ER - Neurology also following, present assistance.  Plan is for 5-day course of Solu-Medrol 1 g daily.  Plex would be considered if no improvement - Follow-up autoimmune panels - PPI while on steroids  Hyperglycemia - Suspected in setting of high-dose steroid use - A1c 5.5% - Continue SSI and CBG monitoring  Hypokalemia - Replete as needed  Hyperlipidemia - Continue Crestor  Depression with anxiety - Continue Celexa and PRN Klonopin   Old records reviewed in assessment of this patient  Antimicrobials:   DVT prophylaxis:  enoxaparin (LOVENOX) injection 40 mg Start: 12/17/22 2000   Code Status:   Code Status: Full Code  Mobility Assessment (last 72 hours)     Mobility Assessment     Row Name 12/18/22  1300           Does patient have an order for bedrest or is patient medically unstable No - Continue assessment       What is the highest level of mobility based on the progressive mobility assessment? Level 6 (Walks independently in room and hall) - Balance while walking in room without assist - Complete                Barriers to discharge:  Disposition Plan: Home Saturday Status is: Inpatient  Objective: Blood pressure (!) 145/85, pulse 86, temperature 98.1 F (36.7 C), resp. rate 18, height 5' 4"$  (1.626 m), weight 79.4 kg, last menstrual period 11/21/2022, SpO2 96 %.  Examination:  Physical Exam Constitutional:      Appearance: Normal appearance.  HENT:     Head: Normocephalic and atraumatic.     Mouth/Throat:     Mouth: Mucous membranes are moist.  Eyes:     Extraocular Movements: Extraocular movements intact.     Pupils: Pupils are equal, round, and reactive to light.  Cardiovascular:     Rate and Rhythm: Normal rate and regular rhythm.  Pulmonary:     Effort: Pulmonary effort is normal.     Breath sounds: Normal breath sounds.  Abdominal:     General: Bowel sounds are normal. There is no distension.     Palpations: Abdomen is soft.     Tenderness: There is no abdominal tenderness.  Musculoskeletal:        General: Normal range of motion.  Cervical back: Normal range of motion and neck supple.  Skin:    General: Skin is warm and dry.  Neurological:     Mental Status: She is alert.     Comments: Decreased visual acuity noted right eye  Psychiatric:        Mood and Affect: Mood normal.      Consultants:  Neurology  Procedures:    Data Reviewed: Results for orders placed or performed during the hospital encounter of 12/17/22 (from the past 24 hour(s))  Basic metabolic panel     Status: Abnormal   Collection Time: 12/17/22  4:12 PM  Result Value Ref Range   Sodium 136 135 - 145 mmol/L   Potassium 3.4 (L) 3.5 - 5.1 mmol/L   Chloride 101 98 - 111  mmol/L   CO2 25 22 - 32 mmol/L   Glucose, Bld 115 (H) 70 - 99 mg/dL   BUN 16 6 - 20 mg/dL   Creatinine, Ser 0.85 0.44 - 1.00 mg/dL   Calcium 9.2 8.9 - 10.3 mg/dL   GFR, Estimated >60 >60 mL/min   Anion gap 10 5 - 15  CBC with Differential     Status: Abnormal   Collection Time: 12/17/22  4:12 PM  Result Value Ref Range   WBC 11.4 (H) 4.0 - 10.5 K/uL   RBC 5.03 3.87 - 5.11 MIL/uL   Hemoglobin 14.7 12.0 - 15.0 g/dL   HCT 43.8 36.0 - 46.0 %   MCV 87.1 80.0 - 100.0 fL   MCH 29.2 26.0 - 34.0 pg   MCHC 33.6 30.0 - 36.0 g/dL   RDW 14.3 11.5 - 15.5 %   Platelets 332 150 - 400 K/uL   nRBC 0.0 0.0 - 0.2 %   Neutrophils Relative % 61 %   Neutro Abs 6.9 1.7 - 7.7 K/uL   Lymphocytes Relative 30 %   Lymphs Abs 3.4 0.7 - 4.0 K/uL   Monocytes Relative 7 %   Monocytes Absolute 0.8 0.1 - 1.0 K/uL   Eosinophils Relative 1 %   Eosinophils Absolute 0.1 0.0 - 0.5 K/uL   Basophils Relative 1 %   Basophils Absolute 0.1 0.0 - 0.1 K/uL   Immature Granulocytes 0 %   Abs Immature Granulocytes 0.03 0.00 - 0.07 K/uL  Basic metabolic panel     Status: Abnormal   Collection Time: 12/18/22  4:26 AM  Result Value Ref Range   Sodium 132 (L) 135 - 145 mmol/L   Potassium 4.7 3.5 - 5.1 mmol/L   Chloride 99 98 - 111 mmol/L   CO2 19 (L) 22 - 32 mmol/L   Glucose, Bld 251 (H) 70 - 99 mg/dL   BUN 14 6 - 20 mg/dL   Creatinine, Ser 1.02 (H) 0.44 - 1.00 mg/dL   Calcium 9.0 8.9 - 10.3 mg/dL   GFR, Estimated >60 >60 mL/min   Anion gap 14 5 - 15  Magnesium     Status: None   Collection Time: 12/18/22  4:26 AM  Result Value Ref Range   Magnesium 2.1 1.7 - 2.4 mg/dL  CBC     Status: None   Collection Time: 12/18/22  4:26 AM  Result Value Ref Range   WBC 9.7 4.0 - 10.5 K/uL   RBC 4.88 3.87 - 5.11 MIL/uL   Hemoglobin 14.3 12.0 - 15.0 g/dL   HCT 41.6 36.0 - 46.0 %   MCV 85.2 80.0 - 100.0 fL   MCH 29.3 26.0 - 34.0 pg   MCHC  34.4 30.0 - 36.0 g/dL   RDW 13.9 11.5 - 15.5 %   Platelets 301 150 - 400 K/uL   nRBC  0.0 0.0 - 0.2 %  Hemoglobin A1c     Status: None   Collection Time: 12/18/22  4:26 AM  Result Value Ref Range   Hgb A1c MFr Bld 5.5 4.8 - 5.6 %   Mean Plasma Glucose 111.15 mg/dL  CBG monitoring, ED     Status: Abnormal   Collection Time: 12/18/22  8:21 AM  Result Value Ref Range   Glucose-Capillary 280 (H) 70 - 99 mg/dL   Comment 1 Document in Chart   Glucose, capillary     Status: Abnormal   Collection Time: 12/18/22 11:50 AM  Result Value Ref Range   Glucose-Capillary 239 (H) 70 - 99 mg/dL    I have reviewed pertinent nursing notes, vitals, labs, and images as necessary. I have ordered labwork to follow up on as indicated.  I have reviewed the last notes from staff over past 24 hours. I have discussed patient's care plan and test results with nursing staff, CM/SW, and other staff as appropriate.  Time spent: Greater than 50% of the 55 minute visit was spent in counseling/coordination of care for the patient as laid out in the A&P.   LOS: 1 day   Dwyane Dee, MD [facility] 12/18/2022, 3:06 PM

## 2022-12-18 NOTE — Assessment & Plan Note (Signed)
-   Continue Celexa and PRN Klonopin

## 2022-12-18 NOTE — Assessment & Plan Note (Addendum)
-   Suspected in setting of high-dose steroid use - A1c 5.5%

## 2022-12-18 NOTE — Assessment & Plan Note (Addendum)
-   Repleted 

## 2022-12-18 NOTE — Care Management (Signed)
  Transition of Care [facility]) Screening Note   Patient Details  Name: Diane May Date of Birth: 04/20/1976   [facility] of [facility]) CM/SW Contact:    Levonne Lapping, RN Phone Number: 12/18/2022, 3:25 PM    Transition of Care Department [facility]) has reviewed patient and no TOC needs have been identified at this time. We will continue to monitor patient advancement through interdisciplinary progression rounds. If new patient transition needs arise, please place a TOC consult.

## 2022-12-18 NOTE — Hospital Course (Addendum)
Diane May is a 47 yo female with PMH anxiety and hyperlipidemia who presented to the ER with complaints of blurry vision and pain with right eye movement.  She endorsed symptoms beginning around 12/13/2022 associated with almost complete vision loss that day and painful eye movements.  She had been seen in the ER and was treated with Solu-Medrol and discharged for outpatient follow-up.  She was seen by ophthalmology and recommended for returning to the ER for admission and ongoing high-dose steroids due to concern for optic neuritis. She was evaluated by neurology on admission as well and started on 5-day course of 1 g Solu-Medrol daily.  She had resolution of her eye symptoms.  Pain with eye movement and blurry vision resolved.  Color vision also returned.  She was recommended for outpatient follow-up with neurology at discharge which she understands.

## 2022-12-18 NOTE — Assessment & Plan Note (Addendum)
-   Evaluated outpatient by ophthalmology with concern for optic neuritis and referred to the ER - Neurology also following, present assistance.  Plan is for 5-day course of Solu-Medrol 1 g daily.  Course complete did on 12/21/2022 -Eye symptoms have improved since admission and starting steroids; no further symptoms at discharge -Follow-up remaining labs at neurology appointment

## 2022-12-18 NOTE — ED Notes (Signed)
ED TO INPATIENT HANDOFF REPORT  ED Nurse Name and Phone #: Su Grand [phone]  S Name/Age/Gender Diane May 47 y.o. female Room/Bed: 037C/037C  Code Status   Code Status: Full Code  Home/SNF/Other Home Patient oriented to: self, place, time, and situation Is this baseline? Yes   Triage Complete: Triage complete  Chief Complaint Optic neuritis [H46.9]  Triage Note Pt presents with vision trouble in right eye since Saturday. Pt states she was seen at [facility] for the same a couple days ago but her eye doctor said that she needed IV steroids due to inflammation persisting.    Allergies Allergies  Allergen Reactions   Penicillins Rash    Level of Care/Admitting Diagnosis ED Disposition     ED Disposition  Admit   Condition  --   Comment  Hospital Area: [place] [[id number]]  Level of Care: Med-Surg [16]  May admit patient to [facility] or [facility] if equivalent level of care is available:: No  Covid Evaluation: Asymptomatic - no recent exposure (last 10 days) testing not required  Diagnosis: Optic neuritis [id number]  Admitting Physician: Vianne Bulls [id number]  Attending Physician: Vianne Bulls [id number]  Certification:: I certify this patient will need inpatient services for at least 2 midnights  Estimated Length of Stay: 3          B Medical/Surgery History Past Medical History:  Diagnosis Date   Allergy    Anxiety    Depression    Dyslipidemia (high LDL; low HDL)    Past Surgical History:  Procedure Laterality Date   BREAST SURGERY     cyst removal from L breast   CESAREAN SECTION     x3   CHOLECYSTECTOMY       A IV Location/Drains/Wounds Patient Lines/Drains/Airways Status     Active Line/Drains/Airways     Name Placement date Placement time Site Days   Peripheral IV 12/17/22 20 G Anterior;Distal;Left;Upper Arm 12/17/22  1948  Arm  1            Intake/Output Last 24 hours No intake or output  data in the 24 hours ending 12/18/22 1017  Labs/Imaging Results for orders placed or performed during the hospital encounter of 12/17/22 (from the past 48 hour(s))  Basic metabolic panel     Status: Abnormal   Collection Time: 12/17/22  4:12 PM  Result Value Ref Range   Sodium 136 135 - 145 mmol/L   Potassium 3.4 (L) 3.5 - 5.1 mmol/L   Chloride 101 98 - 111 mmol/L   CO2 25 22 - 32 mmol/L   Glucose, Bld 115 (H) 70 - 99 mg/dL    Comment: Glucose reference range applies only to samples taken after fasting for at least 8 hours.   BUN 16 6 - 20 mg/dL   Creatinine, Ser 0.85 0.44 - 1.00 mg/dL   Calcium 9.2 8.9 - 10.3 mg/dL   GFR, Estimated >60 >60 mL/min    Comment: (NOTE) Calculated using the CKD-EPI Creatinine Equation (2021)    Anion gap 10 5 - 15    Comment: Performed at [facility] [address] [place], [place] [address]  CBC with Differential     Status: Abnormal   Collection Time: 12/17/22  4:12 PM  Result Value Ref Range   WBC 11.4 (H) 4.0 - 10.5 K/uL   RBC 5.03 3.87 - 5.11 MIL/uL   Hemoglobin 14.7 12.0 - 15.0 g/dL   HCT 43.8 36.0 - 46.0 %  MCV 87.1 80.0 - 100.0 fL   MCH 29.2 26.0 - 34.0 pg   MCHC 33.6 30.0 - 36.0 g/dL   RDW 14.3 11.5 - 15.5 %   Platelets 332 150 - 400 K/uL   nRBC 0.0 0.0 - 0.2 %   Neutrophils Relative % 61 %   Neutro Abs 6.9 1.7 - 7.7 K/uL   Lymphocytes Relative 30 %   Lymphs Abs 3.4 0.7 - 4.0 K/uL   Monocytes Relative 7 %   Monocytes Absolute 0.8 0.1 - 1.0 K/uL   Eosinophils Relative 1 %   Eosinophils Absolute 0.1 0.0 - 0.5 K/uL   Basophils Relative 1 %   Basophils Absolute 0.1 0.0 - 0.1 K/uL   Immature Granulocytes 0 %   Abs Immature Granulocytes 0.03 0.00 - 0.07 K/uL    Comment: Performed at [facility] [address], [place], [place] [id number]  Basic metabolic panel     Status: Abnormal   Collection Time: 12/18/22  4:26 AM  Result Value Ref Range   Sodium 132 (L) 135 - 145 mmol/L   Potassium 4.7 3.5 - 5.1 mmol/L    Chloride 99 98 - 111 mmol/L   CO2 19 (L) 22 - 32 mmol/L   Glucose, Bld 251 (H) 70 - 99 mg/dL    Comment: Glucose reference range applies only to samples taken after fasting for at least 8 hours.   BUN 14 6 - 20 mg/dL   Creatinine, Ser 1.02 (H) 0.44 - 1.00 mg/dL   Calcium 9.0 8.9 - 10.3 mg/dL   GFR, Estimated >60 >60 mL/min    Comment: (NOTE) Calculated using the CKD-EPI Creatinine Equation (2021)    Anion gap 14 5 - 15    Comment: Performed at [facility] [address] [place], [place] [address]  Magnesium     Status: None   Collection Time: 12/18/22  4:26 AM  Result Value Ref Range   Magnesium 2.1 1.7 - 2.4 mg/dL    Comment: Performed at [facility] [address] [place] [address]  CBC     Status: None   Collection Time: 12/18/22  4:26 AM  Result Value Ref Range   WBC 9.7 4.0 - 10.5 K/uL   RBC 4.88 3.87 - 5.11 MIL/uL   Hemoglobin 14.3 12.0 - 15.0 g/dL   HCT 41.6 36.0 - 46.0 %   MCV 85.2 80.0 - 100.0 fL   MCH 29.3 26.0 - 34.0 pg   MCHC 34.4 30.0 - 36.0 g/dL   RDW 13.9 11.5 - 15.5 %   Platelets 301 150 - 400 K/uL   nRBC 0.0 0.0 - 0.2 %    Comment: Performed at [facility], [place] [address] [place], [place] [address]  Hemoglobin A1c     Status: None   Collection Time: 12/18/22  4:26 AM  Result Value Ref Range   Hgb A1c MFr Bld 5.5 4.8 - 5.6 %    Comment: (NOTE) Pre diabetes:          5.7%-6.4%  Diabetes:              >6.4%  Glycemic control for   <7.0% adults with diabetes    Mean Plasma Glucose 111.15 mg/dL    Comment: Performed at [facility] [address], [place], [place] [address]  CBG monitoring, ED     Status: Abnormal   Collection Time: 12/18/22  8:21 AM  Result Value Ref  Range   Glucose-Capillary 280 (H) 70 - 99 mg/dL    Comment: Glucose reference range applies only to samples taken after fasting for at least 8 hours.   Comment 1 Document in Chart    Fluorescein Angiography Optos (Transit OD)  Result Date:  12/17/2022 Right Eye Progression has no prior data. Early phase findings include normal observations. Mid/Late phase findings include leakage, staining (Hyper fluorescence of the disc). Left Eye Progression has no prior data. Early phase findings include normal observations. Mid/Late phase findings include normal observations (No leakage or staining). Notes **Images stored on drive** Impression: OD: Hyper fluorescence of the disc OS: normal study, no leakage or staining   OCT, Retina - OU - Both Eyes  Result Date: 12/17/2022 Right Eye Quality was good. Central Foveal Thickness: 298. Progression has no prior data. Findings include normal foveal contour, no IRF, no SRF, vitreomacular adhesion . Left Eye Quality was good. Central Foveal Thickness: 301. Progression has no prior data. Findings include normal foveal contour, no IRF, no SRF, vitreomacular adhesion . Notes *Images captured and stored on drive Diagnosis / Impression: NFP, no IRF/SRF OU Clinical management: See below Abbreviations: NFP - Normal foveal profile. CME - cystoid macular edema. PED - pigment epithelial detachment. IRF - intraretinal fluid. SRF - subretinal fluid. EZ - ellipsoid zone. ERM - epiretinal membrane. ORA - outer retinal atrophy. ORT - outer retinal tubulation. SRHM - subretinal hyper-reflective material. IRHM - intraretinal hyper-reflective material    Pending Labs Unresulted Labs (From admission, onward)     Start     Ordered   12/24/22 0500  Creatinine, serum  (enoxaparin (LOVENOX)    CrCl >/= 30 ml/min)  Weekly,   R     Comments: while on enoxaparin therapy    12/17/22 1938   12/18/22 [id number]  Basic metabolic panel  Daily,   R      12/17/22 1938   12/17/22 1946  Miscellaneous [facility] test (send-out)  Once,   R       Comments: TEST: [id number]   Question:  Test name / description:  Answer:  Anti MOG antibody, TEST: 505310   12/17/22 1948   12/17/22 1945  Neuromyelitis optica autoab, IgG  Once,   R        12/17/22 1948             Vitals/Pain Today's Vitals   12/18/22 0700 12/18/22 0730 12/18/22 0835 12/18/22 0900  BP: 101/89 114/69 123/85   Pulse: 76 77 93 (!) 104  Resp: 16     Temp: 97.8 F (36.6 C)     TempSrc: Oral     SpO2: 95% 93% 95% 93%  Weight:      Height:      PainSc:        Isolation Precautions No active isolations  Medications Medications  rosuvastatin (CRESTOR) tablet 10 mg (10 mg Oral Given 12/18/22 0927)  citalopram (CELEXA) tablet 40 mg (40 mg Oral Given 12/18/22 0928)  clonazePAM (KLONOPIN) tablet 0.5 mg (0.5 mg Oral Given 12/17/22 2329)  albuterol (PROVENTIL) (2.5 MG/3ML) 0.083% nebulizer solution 2.5 mg (has no administration in time range)  enoxaparin (LOVENOX) injection 40 mg (40 mg Subcutaneous Given 12/17/22 1959)  acetaminophen (TYLENOL) tablet 650 mg (650 mg Oral Given 12/18/22 0831)    Or  acetaminophen (TYLENOL) suppository 650 mg ( Rectal See Alternative 12/18/22 0831)  senna-docusate (Senokot-S) tablet 1 tablet (has no administration in time range)  methylPREDNISolone sodium succinate (SOLU-MEDROL) 1,000 mg in sodium chloride  0.9 % 50 mL IVPB (has no administration in time range)    And  pantoprazole (PROTONIX) injection 40 mg (40 mg Intravenous Given 12/17/22 2213)  insulin aspart (novoLOG) injection 0-15 Units (8 Units Subcutaneous Given 12/18/22 0829)  insulin aspart (novoLOG) injection 0-5 Units (has no administration in time range)  methylPREDNISolone sodium succinate (SOLU-MEDROL) 1,000 mg in sodium chloride 0.9 % 50 mL IVPB (0 mg Intravenous Stopped 12/17/22 2105)  potassium chloride SA (KLOR-CON M) CR tablet 40 mEq (40 mEq Oral Given 12/17/22 1959)    Mobility walks     Focused Assessments vision   R Recommendations: See Admitting Provider Note  Report given to:   Additional Notes: call if there are questions

## 2022-12-19 DIAGNOSIS — R739 Hyperglycemia, unspecified: Secondary | ICD-10-CM | POA: Diagnosis not present

## 2022-12-19 DIAGNOSIS — H469 Unspecified optic neuritis: Secondary | ICD-10-CM | POA: Diagnosis not present

## 2022-12-19 LAB — CBC WITH DIFFERENTIAL/PLATELET
Abs Immature Granulocytes: 0.14 10*3/uL — ABNORMAL HIGH (ref 0.00–0.07)
Basophils Absolute: 0 10*3/uL (ref 0.0–0.1)
Basophils Relative: 0 %
Eosinophils Absolute: 0 10*3/uL (ref 0.0–0.5)
Eosinophils Relative: 0 %
HCT: 39.4 % (ref 36.0–46.0)
Hemoglobin: 13.8 g/dL (ref 12.0–15.0)
Immature Granulocytes: 1 %
Lymphocytes Relative: 7 %
Lymphs Abs: 1.2 10*3/uL (ref 0.7–4.0)
MCH: 29.9 pg (ref 26.0–34.0)
MCHC: 35 g/dL (ref 30.0–36.0)
MCV: 85.3 fL (ref 80.0–100.0)
Monocytes Absolute: 0.2 10*3/uL (ref 0.1–1.0)
Monocytes Relative: 1 %
Neutro Abs: 15.7 10*3/uL — ABNORMAL HIGH (ref 1.7–7.7)
Neutrophils Relative %: 91 %
Platelets: 278 10*3/uL (ref 150–400)
RBC: 4.62 MIL/uL (ref 3.87–5.11)
RDW: 13.9 % (ref 11.5–15.5)
WBC: 17.2 10*3/uL — ABNORMAL HIGH (ref 4.0–10.5)
nRBC: 0 % (ref 0.0–0.2)

## 2022-12-19 LAB — GLUCOSE, CAPILLARY
Glucose-Capillary: 212 mg/dL — ABNORMAL HIGH (ref 70–99)
Glucose-Capillary: 202 mg/dL — ABNORMAL HIGH (ref 70–99)
Glucose-Capillary: 261 mg/dL — ABNORMAL HIGH (ref 70–99)
Glucose-Capillary: 257 mg/dL — ABNORMAL HIGH (ref 70–99)
Glucose-Capillary: 288 mg/dL — ABNORMAL HIGH (ref 70–99)

## 2022-12-19 LAB — MAGNESIUM: Magnesium: 2.4 mg/dL (ref 1.7–2.4)

## 2022-12-19 LAB — BASIC METABOLIC PANEL
Anion gap: 10 (ref 5–15)
BUN: 13 mg/dL (ref 6–20)
CO2: 23 mmol/L (ref 22–32)
Calcium: 8.8 mg/dL — ABNORMAL LOW (ref 8.9–10.3)
Chloride: 101 mmol/L (ref 98–111)
Creatinine, Ser: 0.88 mg/dL (ref 0.44–1.00)
GFR, Estimated: >60 mL/min (ref >60)
Glucose, Bld: 253 mg/dL — ABNORMAL HIGH (ref 70–99)
Potassium: 3.8 mmol/L (ref 3.5–5.1)
Sodium: 134 mmol/L — ABNORMAL LOW (ref 135–145)

## 2022-12-19 MED ORDER — INSULIN ASPART 100 UNIT/ML IJ SOLN
4.0000 [IU] | Freq: Three times a day (TID) | INTRAMUSCULAR | Status: DC
  Administered 2022-12-19 – 2022-12-21 (×6): 4 [IU] via SUBCUTANEOUS

## 2022-12-19 MED ORDER — SODIUM CHLORIDE 0.9 % IV SOLN
1000.0000 mg | INTRAVENOUS | Status: DC
  Administered 2022-12-20: 1000 mg via INTRAVENOUS
  Filled 2022-12-19 (×2): qty 16

## 2022-12-19 MED ORDER — PANTOPRAZOLE SODIUM 40 MG IV SOLR
40.0000 mg | INTRAVENOUS | Status: DC
  Administered 2022-12-19 – 2022-12-20 (×2): 40 mg via INTRAVENOUS
  Filled 2022-12-19: qty 10

## 2022-12-19 MED ORDER — TRAZODONE HCL 50 MG PO TABS
50.0000 mg | ORAL_TABLET | Freq: Every evening | ORAL | Status: DC | PRN
  Administered 2022-12-19 – 2022-12-20 (×2): 50 mg via ORAL
  Filled 2022-12-19 (×2): qty 1

## 2022-12-19 NOTE — Progress Notes (Signed)
Progress Note    Diane May   [id number]  DOB: [birth year]  DOA: 12/17/2022     2 PCP: Medicine, [facility]  Initial CC: Decreased vision right eye, painful right eye movements  Hospital Course: Ms. Diane May is a 47 yo female with PMH anxiety and hyperlipidemia who presented to the ER with complaints of blurry vision and pain with right eye movement.  She endorsed symptoms beginning around 12/13/2022 associated with almost complete vision loss that day and painful eye movements.  She had been seen in the ER and was treated with Solu-Medrol and discharged for outpatient follow-up.  She was seen by ophthalmology and recommended for returning to the ER for admission and ongoing high-dose steroids due to concern for optic neuritis. She was evaluated by neurology on admission as well and started on 5-day course of 1 g Solu-Medrol daily.  Interval History:  No events overnight.  Continues to state that her eye pain and blurry vision has continued to improve since admission.  No new neurologic complaints either.  Assessment and Plan: * Optic neuritis - Evaluated outpatient by ophthalmology with concern for optic neuritis and referred to the ER - Neurology also following, present assistance.  Plan is for 5-day course of Solu-Medrol 1 g daily.  Plex would be considered if no improvement -Eye symptoms have improved since admission and starting steroids - Follow-up autoimmune panels - PPI while on steroids  Hyperglycemia - Suspected in setting of high-dose steroid use - A1c 5.5% - Continue SSI and CBG monitoring  Hypokalemia - Replete as needed  Hyperlipidemia - Continue Crestor  Depression with anxiety - Continue Celexa and PRN Klonopin   Old records reviewed in assessment of this patient  Antimicrobials:   DVT prophylaxis:  enoxaparin (LOVENOX) injection 40 mg Start: 12/17/22 2000   Code Status:   Code Status: Full Code  Mobility Assessment (last 72 hours)      Mobility Assessment     Row Name 12/18/22 1300           Does patient have an order for bedrest or is patient medically unstable No - Continue assessment       What is the highest level of mobility based on the progressive mobility assessment? Level 6 (Walks independently in room and hall) - Balance while walking in room without assist - Complete                Barriers to discharge:  Disposition Plan: Home Saturday Status is: Inpatient  Objective: Blood pressure 124/75, pulse 78, temperature 98.3 F (36.8 C), temperature source Oral, resp. rate 17, height 5' 4"$  (1.626 m), weight 79.4 kg, last menstrual period 11/21/2022, SpO2 96 %.  Examination:  Physical Exam Constitutional:      Appearance: Normal appearance.  HENT:     Head: Normocephalic and atraumatic.     Mouth/Throat:     Mouth: Mucous membranes are moist.  Eyes:     Extraocular Movements: Extraocular movements intact.     Pupils: Pupils are equal, round, and reactive to light.  Cardiovascular:     Rate and Rhythm: Normal rate and regular rhythm.  Pulmonary:     Effort: Pulmonary effort is normal.     Breath sounds: Normal breath sounds.  Abdominal:     General: Bowel sounds are normal. There is no distension.     Palpations: Abdomen is soft.     Tenderness: There is no abdominal tenderness.  Musculoskeletal:  General: Normal range of motion.     Cervical back: Normal range of motion and neck supple.  Skin:    General: Skin is warm and dry.  Neurological:     Mental Status: She is alert.     Comments: Improved right eye visual acuity  Psychiatric:        Mood and Affect: Mood normal.      Consultants:  Neurology  Procedures:    Data Reviewed: Results for orders placed or performed during the hospital encounter of 12/17/22 (from the past 24 hour(s))  Glucose, capillary     Status: Abnormal   Collection Time: 12/18/22  9:52 PM  Result Value Ref Range   Glucose-Capillary 297 (H) 70 -  99 mg/dL  Glucose, capillary     Status: Abnormal   Collection Time: 12/19/22  4:14 AM  Result Value Ref Range   Glucose-Capillary 212 (H) 70 - 99 mg/dL  Basic metabolic panel     Status: Abnormal   Collection Time: 12/19/22  4:56 AM  Result Value Ref Range   Sodium 134 (L) 135 - 145 mmol/L   Potassium 3.8 3.5 - 5.1 mmol/L   Chloride 101 98 - 111 mmol/L   CO2 23 22 - 32 mmol/L   Glucose, Bld 253 (H) 70 - 99 mg/dL   BUN 13 6 - 20 mg/dL   Creatinine, Ser 0.88 0.44 - 1.00 mg/dL   Calcium 8.8 (L) 8.9 - 10.3 mg/dL   GFR, Estimated >60 >60 mL/min   Anion gap 10 5 - 15  CBC with Differential/Platelet     Status: Abnormal   Collection Time: 12/19/22  4:56 AM  Result Value Ref Range   WBC 17.2 (H) 4.0 - 10.5 K/uL   RBC 4.62 3.87 - 5.11 MIL/uL   Hemoglobin 13.8 12.0 - 15.0 g/dL   HCT 39.4 36.0 - 46.0 %   MCV 85.3 80.0 - 100.0 fL   MCH 29.9 26.0 - 34.0 pg   MCHC 35.0 30.0 - 36.0 g/dL   RDW 13.9 11.5 - 15.5 %   Platelets 278 150 - 400 K/uL   nRBC 0.0 0.0 - 0.2 %   Neutrophils Relative % 91 %   Neutro Abs 15.7 (H) 1.7 - 7.7 K/uL   Lymphocytes Relative 7 %   Lymphs Abs 1.2 0.7 - 4.0 K/uL   Monocytes Relative 1 %   Monocytes Absolute 0.2 0.1 - 1.0 K/uL   Eosinophils Relative 0 %   Eosinophils Absolute 0.0 0.0 - 0.5 K/uL   Basophils Relative 0 %   Basophils Absolute 0.0 0.0 - 0.1 K/uL   Immature Granulocytes 1 %   Abs Immature Granulocytes 0.14 (H) 0.00 - 0.07 K/uL  Magnesium     Status: None   Collection Time: 12/19/22  4:56 AM  Result Value Ref Range   Magnesium 2.4 1.7 - 2.4 mg/dL  Glucose, capillary     Status: Abnormal   Collection Time: 12/19/22  7:37 AM  Result Value Ref Range   Glucose-Capillary 202 (H) 70 - 99 mg/dL  Glucose, capillary     Status: Abnormal   Collection Time: 12/19/22 11:46 AM  Result Value Ref Range   Glucose-Capillary 261 (H) 70 - 99 mg/dL  Glucose, capillary     Status: Abnormal   Collection Time: 12/19/22  4:08 PM  Result Value Ref Range    Glucose-Capillary 257 (H) 70 - 99 mg/dL    I have reviewed pertinent nursing notes, vitals, labs, and images as  necessary. I have ordered labwork to follow up on as indicated.  I have reviewed the last notes from staff over past 24 hours. I have discussed patient's care plan and test results with nursing staff, CM/SW, and other staff as appropriate.  Time spent: Greater than 50% of the 55 minute visit was spent in counseling/coordination of care for the patient as laid out in the A&P.   LOS: 2 days   Dwyane Dee, MD [facility] 12/19/2022, 5:06 PM

## 2022-12-19 NOTE — Progress Notes (Signed)
[facility] Note  01/02/2023     CHIEF COMPLAINT Patient presents for Retina Follow Up   HISTORY OF PRESENT ILLNESS: Diane May is a 47 y.o. female who presents to the clinic today for:   HPI     Retina Follow Up   Patient presents with  Other.  In right eye.  Severity is moderate.  Duration of 2 weeks.  Since onset it is gradually improving.  I, the attending physician,  performed the HPI with the patient and updated documentation appropriately.        Comments   Pt here for 2 wk ret f/u for optic neuritis OD. PT states her stay in the hospital was good, VA has improved. Unsure if its 100%. Not on any oral steroids or gtts.       Last edited by Bernarda Caffey, MD on 01/02/2023 12:38 PM.    Pt was admitted to the hospital after she left here last time, she was there for 5 days and given IV steroids, she has a f/u with neurology (Dr. Felecia Shelling) on March 14, she was not discharged with any oral steroids   Referring physician: Medicine, [facility] [place],  [place] [address]  HISTORICAL INFORMATION:   Selected notes from the MEDICAL RECORD NUMBER Referred by Dr. Marin Comment for vision loss OD LEE:  Ocular Hx- PMH-    CURRENT MEDICATIONS: No current outpatient medications on file. (Ophthalmic Drugs)   No current facility-administered medications for this visit. (Ophthalmic Drugs)   Current Outpatient Medications (Other)  Medication Sig   albuterol (VENTOLIN HFA) 108 (90 Base) MCG/ACT inhaler Inhale 2 puffs into the lungs every 4 (four) hours as needed for wheezing or shortness of breath.   citalopram (CELEXA) 40 MG tablet Take 40 mg by mouth daily.   clonazePAM (KLONOPIN) 0.5 MG tablet Take 0.5 mg by mouth 3 (three) times daily as needed for anxiety.   levocetirizine (XYZAL) 5 MG tablet Take 5 mg by mouth at bedtime.   phentermine (ADIPEX-P) 37.5 MG tablet Take 37.5 mg by mouth every morning.   rosuvastatin (CRESTOR) 10 MG tablet  Take 10 mg by mouth daily.   No current facility-administered medications for this visit. (Other)   REVIEW OF SYSTEMS: ROS   Positive for: Neurological, Eyes, Psychiatric Negative for: Constitutional, Gastrointestinal, Skin, Genitourinary, Musculoskeletal, HENT, Endocrine, Cardiovascular, Respiratory, Allergic/Imm, Heme/Lymph Last edited by Kingsley Spittle, COT on 01/02/2023  9:13 AM.     ALLERGIES Allergies  Allergen Reactions   Penicillins Rash   PAST MEDICAL HISTORY Past Medical History:  Diagnosis Date   Allergy    Anxiety    Depression    Dyslipidemia (high LDL; low HDL)    Past Surgical History:  Procedure Laterality Date   BREAST SURGERY     cyst removal from L breast   CESAREAN SECTION     x3   CHOLECYSTECTOMY     FAMILY HISTORY Family History  Problem Relation Age of Onset   Cancer Mother    Cancer Father    Breast cancer Sister        half sister    SOCIAL HISTORY Social History   Tobacco Use   Smoking status: Never   Smokeless tobacco: Never  Vaping Use   Vaping Use: Never used  Substance Use Topics   Alcohol use: No    Alcohol/week: 0.0 standard drinks of alcohol   Drug use: No  OPHTHALMIC EXAM:  Base Eye Exam     Visual Acuity (Snellen - Linear)       Right Left   Dist cc 20/20 -1 20/20    Correction: Glasses         Tonometry (Tonopen, 9:18 AM)       Right Left   Pressure 16 18         Pupils       Pupils Dark Light Shape React APD   Right PERRL 3 2 Round Brisk None   Left PERRL 4 3 Round Brisk None         Visual Fields       Left Right    Full Full         Extraocular Movement       Right Left    Full, Ortho Full, Ortho         Neuro/Psych     Oriented x3: Yes   Mood/Affect: Normal         Dilation     Both eyes: 1.0% Mydriacyl, 2.5% Phenylephrine @ 9:19 AM           Additional Tests     Color       Right Left   Ishihara 10/15 10/15           Slit Lamp and Fundus  Exam     Slit Lamp Exam       Right Left   Lids/Lashes Normal Normal   Conjunctiva/Sclera White and quiet White and quiet   Cornea Arcus, mild sub epi scar IN midzone Arcus, mild sub epi scar IN midzone   Anterior Chamber deep and clear deep and clear   Iris Round and dilated Round and dilated   Lens 1-2+ Nuclear sclerosis, 1-2+ Cortical cataract 1-2+ Nuclear sclerosis, 1-2+ Cortical cataract   Anterior Vitreous mild syneresis mild syneresis         Fundus Exam       Right Left   Disc Pink and Sharp, no edema Pink and Sharp, Compact, no edema or elevation   C/D Ratio 0.3 0.2   Macula Flat, Good foveal reflex, RPE mottling, No heme or edema Flat, Good foveal reflex, RPE mottling, No heme or edema   Vessels mild tortuosity mild tortuosity, mild AV crossing changes   Periphery Attached, No heme Attached            IMAGING AND PROCEDURES  Imaging and Procedures for 01/02/2023  OCT, Retina - OU - Both Eyes       Right Eye Quality was good. Central Foveal Thickness: 304. Progression has been stable. Findings include normal foveal contour, no IRF, no SRF, vitreomacular adhesion (Interval improvement in disc elevation / edema).   Left Eye Quality was good. Central Foveal Thickness: 306. Progression has been stable. Findings include normal foveal contour, no IRF, no SRF, vitreomacular adhesion (Interval improvement in disc elevation / edmea).   Notes *Images captured and stored on drive  Diagnosis / Impression:  NFP, no IRF/SRF OU Interval improvement in disc elevation / edema OU  Clinical management:  See below  Abbreviations: NFP - Normal foveal profile. CME - cystoid macular edema. PED - pigment epithelial detachment. IRF - intraretinal fluid. SRF - subretinal fluid. EZ - ellipsoid zone. ERM - epiretinal membrane. ORA - outer retinal atrophy. ORT - outer retinal tubulation. SRHM - subretinal hyper-reflective material. IRHM - intraretinal hyper-reflective material             ASSESSMENT/PLAN:  ICD-10-CM   1. Optic neuritis, right  H46.9 OCT, Retina - OU - Both Eyes    2. Combined forms of age-related cataract of both eyes  H25.813      Optic neuritis OD - Pt developed decreased vision and pain with eye movements upon waking Friday, 02.09.24 - presented to Dr. Marin Comment, Optometry who directed pt to ED for further evaluation - Pt presented [facility] ED on 2.9.24, transferred to [facility] ED where MRI showed no demyelinating lesions on brain or spinal cord - pt received 1 dose of IV solumedrol, pt was seen by Neurology and discharged with recommendation of outpt ophthalmology f/u, no IV steroids - pt was seen here on 02.13.24 where dilated eye exam shows BCVA 20/150 OD, decreased color vision, mild elevation of optic disc w/ blurring of margins - fluorescein angiogram 2.13.24 showed hyperfluorescence of disc OD consistent with inflammation / optic neuritis - pt was sent to ED and readmitted -- received 1g IV solumedrol daily x5 days (02.13.24 - 02.17.24) - recommended steroid regimen per ONTT is Methylprednisolone 1 g/day i.v. for 3 days, then Prednisone 1 mg/kg/day p.o. for 11 days, then Taper prednisone over 4 days (20 mg on day 1, 10 mg on days 2 through 4). Antiulcer medication (e.g., omeprazole 20 mg p.o. daily or ranitidine 150 mg p.o. b.i.d.) for gastric prophylaxis. - visual acuity and eye pain improved during hospitalization - today, pt reports vision is back to baseline (20/20 OU) and pain with EOM is resolved; color vision also improved - pt has Neurology f/u appointment w/ Dr. Felecia Shelling on 03.14.24 -- will send our notes - no retinal or ophthalmic interventions indicated or recommended   - f/u in 6 mos, sooner prn - DFE/OCT  2. Mixed Cataract OU - The symptoms of cataract, surgical options, and treatments and risks were discussed with patient. - discussed diagnosis and progression - monitor  Ophthalmic Meds Ordered this visit:  No orders of the  defined types were placed in this encounter.    Return in about 6 months (around 07/03/2023) for f/u optic neuritis OD, DFE, OCT.  There are no Patient Instructions on file for this visit.   Explained the diagnoses, plan, and follow up with the patient and they expressed understanding.  Patient expressed understanding of the importance of proper follow up care.   This document serves as a record of services personally performed by Gardiner Sleeper, MD, PhD. It was created on their behalf by San Jetty. Owens Shark, OA an ophthalmic technician. The creation of this record is the provider's dictation and/or activities during the visit.    Electronically signed by: San Jetty. Marguerita Merles 02.15.2024 12:39 PM  Gardiner Sleeper, M.D., Ph.D. Diseases & Surgery of the Retina and Vitreous [facility]  I have reviewed the above documentation for accuracy and completeness, and I agree with the above. Gardiner Sleeper, M.D., Ph.D. 01/02/23 12:57 PM  Abbreviations: M myopia (nearsighted); A astigmatism; H hyperopia (farsighted); P presbyopia; Mrx spectacle prescription;  CTL contact lenses; OD right eye; OS left eye; OU both eyes  XT exotropia; ET esotropia; PEK punctate epithelial keratitis; PEE punctate epithelial erosions; DES dry eye syndrome; MGD meibomian gland dysfunction; ATs artificial tears; PFAT's preservative free artificial tears; Colbert nuclear sclerotic cataract; PSC posterior subcapsular cataract; ERM epi-retinal membrane; PVD posterior vitreous detachment; RD retinal detachment; DM diabetes mellitus; DR diabetic retinopathy; NPDR non-proliferative diabetic retinopathy; PDR proliferative diabetic retinopathy; CSME clinically significant macular edema; DME diabetic  macular edema; dbh dot blot hemorrhages; CWS cotton wool spot; POAG primary open angle glaucoma; C/D cup-to-disc ratio; HVF humphrey visual field; GVF goldmann visual field; OCT optical coherence tomography; IOP intraocular  pressure; BRVO Branch retinal vein occlusion; CRVO central retinal vein occlusion; CRAO central retinal artery occlusion; BRAO branch retinal artery occlusion; RT retinal tear; SB scleral buckle; PPV pars plana vitrectomy; VH Vitreous hemorrhage; PRP panretinal laser photocoagulation; IVK intravitreal kenalog; VMT vitreomacular traction; MH Macular hole;  NVD neovascularization of the disc; NVE neovascularization elsewhere; AREDS age related eye disease study; ARMD age related macular degeneration; POAG primary open angle glaucoma; EBMD epithelial/anterior basement membrane dystrophy; ACIOL anterior chamber intraocular lens; IOL intraocular lens; PCIOL posterior chamber intraocular lens; Phaco/IOL phacoemulsification with intraocular lens placement; [place] photorefractive keratectomy; LASIK laser assisted in situ keratomileusis; HTN hypertension; DM diabetes mellitus; COPD chronic obstructive pulmonary disease

## 2022-12-19 NOTE — Progress Notes (Signed)
Chart review shows that MOG Ab that was ordered on 2/13 by me was never collected. I am going to re-order and notify RN.  [place] Pager Number [phone]

## 2022-12-19 NOTE — Inpatient Diabetes Management (Signed)
Inpatient Diabetes Program Recommendations  AACE/ADA: New Consensus Statement on Inpatient Glycemic Control (2015)  Target Ranges:  Prepandial:   less than 140 mg/dL      Peak postprandial:   less than 180 mg/dL (1-2 hours)      Critically ill patients:  140 - 180 mg/dL   Lab Results  Component Value Date   GLUCAP 202 (H) 12/19/2022   HGBA1C 5.5 12/18/2022    Review of Glycemic Control  Latest Reference Range & Units 12/18/22 08:21 12/18/22 11:50 12/18/22 17:04 12/18/22 21:52 12/19/22 04:14 12/19/22 07:37  Glucose-Capillary 70 - 99 mg/dL 280 (H)  Novolog 8 units 239 (H)  Novolog 5 units 270 (H)  Novolog 8 units 297 (H) 212 (H) 202 (H)  Novolog 5 units   Diabetes history: None  Current orders for Inpatient glycemic control:  Novolog 0-15 units tid + hs  Solumedrol 1000 mg Q24 hours  Inpatient Diabetes Program Recommendations:    - consider starting Novolog 4 units tid meal coverage if eating > 50% of meals to prevent postprandial spikes due to steroids.  Thanks,  Tama Headings RN, MSN, BC-ADM Inpatient Diabetes Coordinator Team Pager [phone] (8a-5p)

## 2022-12-19 NOTE — Progress Notes (Addendum)
Neurology Progress Note   S:// Patient asleep in bed in NAD. No family at the bedside. She states improvement in symptoms, has no eye pain and intermittently will have small black spots in right eye vision. She is tolerating high dose steroids, today is day 3/5.    O:// Current vital signs: BP 124/66 (BP Location: Left Arm)   Pulse 74   Temp 98 F (36.7 C) (Oral)   Resp 17   Ht 5' 4"$  (1.626 m)   Wt 79.4 kg   LMP 11/21/2022 (Approximate)   SpO2 96%   BMI 30.04 kg/m  Vital signs in last 24 hours: Temp:  [98 F (36.7 C)-98.5 F (36.9 C)] 98 F (36.7 C) (02/15 0740) Pulse Rate:  [65-104] 74 (02/15 0740) Resp:  [17-18] 17 (02/15 0740) BP: (119-145)/(66-95) 124/66 (02/15 0740) SpO2:  [92 %-97 %] 96 % (02/15 0740)  GENERAL: Awake, alert in NAD HEENT: - Normocephalic and atraumatic LUNGS - Clear to auscultation bilaterally with no wheezes CV - S1S2 RRR, no m/r/g, equal pulses bilaterally. ABDOMEN - Soft, nontender, nondistended with normoactive BS Ext: warm, well perfused, intact peripheral pulses, no edema  NEURO:  Mental Status: AA&Ox4.  Language: speech is clear.  Naming, repetition, fluency, and comprehension intact. Cranial Nerves: PERRL mm/brisk. EOMI, visual fields full, no facial asymmetry, facial sensation intact, hearing intact, tongue/uvula/soft palate midline, normal sternocleidomastoid and trapezius muscle strength. No evidence of tongue atrophy or fibrillations Motor: 5/5 in all 4 extremities  Tone: is normal and bulk is normal Sensation- Intact to light touch bilaterally Coordination: FTN intact bilaterally, no ataxia in BLE. Gait- deferred  Medications  Current Facility-Administered Medications:    acetaminophen (TYLENOL) tablet 650 mg, 650 mg, Oral, Q6H PRN, 650 mg at 12/18/22 0831 **OR** acetaminophen (TYLENOL) suppository 650 mg, 650 mg, Rectal, Q6H PRN, Opyd, Timothy S, MD   albuterol (PROVENTIL) (2.5 MG/3ML) 0.083% nebulizer solution 2.5 mg, 2.5 mg,  Inhalation, Q4H PRN, Opyd, Ilene Qua, MD   citalopram (CELEXA) tablet 40 mg, 40 mg, Oral, Daily, Opyd, Ilene Qua, MD, 40 mg at 12/18/22 [id number]   clonazePAM (KLONOPIN) tablet 0.5 mg, 0.5 mg, Oral, TID PRN, Opyd, Ilene Qua, MD, 0.5 mg at 12/18/22 2127   enoxaparin (LOVENOX) injection 40 mg, 40 mg, Subcutaneous, Q24H, Opyd, Ilene Qua, MD, 40 mg at 12/18/22 2127   insulin aspart (novoLOG) injection 0-15 Units, 0-15 Units, Subcutaneous, TID with meals, Dwyane Dee, MD, 8 Units at 12/18/22 1710   insulin aspart (novoLOG) injection 0-5 Units, 0-5 Units, Subcutaneous, QHS, Girguis, Shanon Brow, MD   methylPREDNISolone sodium succinate (SOLU-MEDROL) 1,000 mg in sodium chloride 0.9 % 50 mL IVPB, 1,000 mg, Intravenous, Q24H, Last Rate: 66 mL/hr at 12/18/22 1715, 1,000 mg at 12/18/22 1715 **AND** pantoprazole (PROTONIX) injection 40 mg, 40 mg, Intravenous, Q24H, Lorrin Goodell, Salman, MD, 40 mg at 12/18/22 2127   Oral care mouth rinse, 15 mL, Mouth Rinse, PRN, Opyd, Ilene Qua, MD   rosuvastatin (CRESTOR) tablet 10 mg, 10 mg, Oral, Daily, Opyd, Ilene Qua, MD, 10 mg at 12/18/22 0927   senna-docusate (Senokot-S) tablet 1 tablet, 1 tablet, Oral, QHS PRN, Vianne Bulls, MD Labs CBC    Component Value Date/Time   WBC 17.2 (H) 12/19/2022 0456   RBC 4.62 12/19/2022 0456   HGB 13.8 12/19/2022 0456   HGB 16.3 (H) 03/02/2020 1349   HCT 39.4 12/19/2022 0456   HCT 47.6 (H) 03/02/2020 1349   PLT 278 12/19/2022 0456   PLT 336 03/02/2020 1349   MCV 85.3 12/19/2022  0456   MCV 90 03/02/2020 1349   MCH 29.9 12/19/2022 0456   MCHC 35.0 12/19/2022 0456   RDW 13.9 12/19/2022 0456   RDW 12.6 03/02/2020 1349   LYMPHSABS 1.2 12/19/2022 0456   MONOABS 0.2 12/19/2022 0456   EOSABS 0.0 12/19/2022 0456   BASOSABS 0.0 12/19/2022 0456    CMP     Component Value Date/Time   NA 134 (L) 12/19/2022 0456   NA 138 08/02/2019 0851   K 3.8 12/19/2022 0456   CL 101 12/19/2022 0456   CO2 23 12/19/2022 0456   GLUCOSE 253 (H)  12/19/2022 0456   BUN 13 12/19/2022 0456   BUN 10 08/02/2019 0851   CREATININE 0.88 12/19/2022 0456   CALCIUM 8.8 (L) 12/19/2022 0456   PROT 7.0 12/14/2022 0248   PROT 6.6 08/02/2019 0851   ALBUMIN 3.5 12/14/2022 0248   ALBUMIN 4.0 08/02/2019 0851   AST 18 12/14/2022 0248   ALT 27 12/14/2022 0248   ALKPHOS 110 12/14/2022 0248   BILITOT 1.1 12/14/2022 0248   BILITOT 0.7 08/02/2019 0851   GFRNONAA >60 12/19/2022 0456   GFRAA 93 08/02/2019 0851    glycosylated hemoglobin  Lipid Panel     Component Value Date/Time   CHOL 208 (H) 08/02/2019 0851   TRIG 139 08/02/2019 0851   HDL 26 (L) 08/02/2019 0851   CHOLHDL 8.0 (H) 08/02/2019 0851   LDLCALC 156 (H) 08/02/2019 0851     Imaging I have reviewed images in epic and the results pertinent to this consultation are:  CT-scan of the brain no acute findings   MRI brain no acute process   MRI c spine and t spine- no evidence of demyelination   Labs WBC 17.2 Na 134 Glucose 253  NMO panel pending  MOG Ab pending   Assessment:  47 y.o. female with PMH significant for HLD, anxiety, depression who presents with painful right eye vision loss.   Impression: Optic neuritis  Leukocytosis likely reactive to steroids  Hyperglycemic due to steroids   Recommendations: - continue high dose IV steroids for 5 days- 105m IV solumedrol daily (day 3/5) - continue PPI while on steroids - Monitor blood glucose and CBC while on steroids  - Will need outpatient follow up with Dr. SFelecia Shellingafter discharge  - Neurology will continue to follow along   DBeulah GandyDNP, ACNPC-AG  [facility]  I have seen the patient and reviewed the above note.  She is having improvement in her vision which is very reassuring.  I will send ANA, angiotensin-converting enzyme, antiphospholipid antibodies as these can be associated with optic neuritis as well.  I will also move her Solu-Medrol forward in the day by few hours tomorrow and then a few  hours more the next day so that she can leave a little earlier.  MRoland Rack MD [facility] [phone] If 7pm- 7am, please page neurology on call as listed in [place]

## 2022-12-20 DIAGNOSIS — H469 Unspecified optic neuritis: Secondary | ICD-10-CM | POA: Diagnosis not present

## 2022-12-20 LAB — CBC WITH DIFFERENTIAL/PLATELET
Abs Immature Granulocytes: 0.32 10*3/uL — ABNORMAL HIGH (ref 0.00–0.07)
Basophils Absolute: 0 10*3/uL (ref 0.0–0.1)
Basophils Relative: 0 %
Eosinophils Absolute: 0 10*3/uL (ref 0.0–0.5)
Eosinophils Relative: 0 %
HCT: 39.4 % (ref 36.0–46.0)
Hemoglobin: 13.4 g/dL (ref 12.0–15.0)
Immature Granulocytes: 2 %
Lymphocytes Relative: 7 %
Lymphs Abs: 1.1 10*3/uL (ref 0.7–4.0)
MCH: 29.3 pg (ref 26.0–34.0)
MCHC: 34 g/dL (ref 30.0–36.0)
MCV: 86.2 fL (ref 80.0–100.0)
Monocytes Absolute: 0.2 10*3/uL (ref 0.1–1.0)
Monocytes Relative: 2 %
Neutro Abs: 13.3 10*3/uL — ABNORMAL HIGH (ref 1.7–7.7)
Neutrophils Relative %: 89 %
Platelets: 277 10*3/uL (ref 150–400)
RBC: 4.57 MIL/uL (ref 3.87–5.11)
RDW: 14 % (ref 11.5–15.5)
WBC: 14.9 10*3/uL — ABNORMAL HIGH (ref 4.0–10.5)
nRBC: 0 % (ref 0.0–0.2)

## 2022-12-20 LAB — BASIC METABOLIC PANEL
Anion gap: 10 (ref 5–15)
BUN: 17 mg/dL (ref 6–20)
CO2: 24 mmol/L (ref 22–32)
Calcium: 8.8 mg/dL — ABNORMAL LOW (ref 8.9–10.3)
Chloride: 102 mmol/L (ref 98–111)
Creatinine, Ser: 0.87 mg/dL (ref 0.44–1.00)
GFR, Estimated: >60 mL/min (ref >60)
Glucose, Bld: 176 mg/dL — ABNORMAL HIGH (ref 70–99)
Potassium: 3.5 mmol/L (ref 3.5–5.1)
Sodium: 136 mmol/L (ref 135–145)

## 2022-12-20 LAB — NEUROMYELITIS OPTICA AUTOAB, IGG: NMO-IgG: <1.5 U/mL (ref 0.0–3.0)

## 2022-12-20 LAB — GLUCOSE, CAPILLARY
Glucose-Capillary: 180 mg/dL — ABNORMAL HIGH (ref 70–99)
Glucose-Capillary: 254 mg/dL — ABNORMAL HIGH (ref 70–99)
Glucose-Capillary: 242 mg/dL — ABNORMAL HIGH (ref 70–99)
Glucose-Capillary: 349 mg/dL — ABNORMAL HIGH (ref 70–99)

## 2022-12-20 LAB — MAGNESIUM: Magnesium: 2.4 mg/dL (ref 1.7–2.4)

## 2022-12-20 NOTE — Plan of Care (Signed)
Pt is alert and Oriented times 4. Tolerated medication. Ambulated to the bathroom , noted. Call bell in reach. Problem: Education: Goal: Ability to describe self-care measures that may prevent or decrease complications (Diabetes Survival Skills Education) will improve Outcome: Progressing Goal: Individualized Educational Video(s) Outcome: Progressing   Problem: Coping: Goal: Ability to adjust to condition or change in health will improve Outcome: Progressing   Problem: Fluid Volume: Goal: Ability to maintain a balanced intake and output will improve Outcome: Progressing   Problem: Health Behavior/Discharge Planning: Goal: Ability to identify and utilize available resources and services will improve Outcome: Progressing Goal: Ability to manage health-related needs will improve Outcome: Progressing

## 2022-12-20 NOTE — Progress Notes (Signed)
I accessed patient chart while assisting a nursing student look at lab values help her with the patient's diagnosis.

## 2022-12-20 NOTE — Progress Notes (Signed)
Neurology Progress Note   S:// Patient awake, lying in bed, NAD. No family at the bedside. She states increased improvement in symptoms, reports no eye pain, no headache and rarely will have small black spots in right eye vision. She is tolerating high dose steroids, today is day 4/5.   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 This has happened occasionally over the last month.   O:// Current vital signs: BP 118/78 (BP Location: Right Wrist)   Pulse 70   Temp 97.9 F (36.6 C) (Oral)   Resp 17   Ht 5' 4"$  (1.626 m)   Wt 79.4 kg   LMP 11/21/2022 (Approximate)   SpO2 93%   BMI 30.04 kg/m  Vital signs in last 24 hours: Temp:  [97.7 F (36.5 C)-98.5 F (36.9 C)] 97.9 F (36.6 C) (02/16 0831) Pulse Rate:  [65-88] 70 (02/16 0831) Resp:  [16-18] 17 (02/16 0831) BP: (109-130)/(66-78) 118/78 (02/16 0831) SpO2:  [92 %-96 %] 93 % (02/16 0300)  GENERAL: Awake, alert in NAD HEENT: - Normocephalic and atraumatic LUNGS - Clear to auscultation bilaterally with no wheezes CV - S1S2 RRR, no m/r/g, equal pulses bilaterally. ABDOMEN - Soft, nontender, nondistended with normoactive BS Ext: warm, well perfused, intact peripheral pulses, no edema  NEURO:  Mental Status: AA&Ox4.  Language: speech is clear.  Naming, repetition, fluency, and comprehension intact. Cranial Nerves: PERRL, EOMI with no pain, visual fields full, no facial asymmetry, facial sensation intact, hearing intact, tongue/uvula/soft palate midline, symmetric head turn and shoulder shrug, midline tongue protrusion.  Motor: 5/5 in all 4 extremities  Tone & bulk normal.  Sensation- Intact to light touch bilaterally Coordination: FTN intact bilaterally, no ataxia in BLE. Gait- deferred  Medications  Current Facility-Administered Medications:    acetaminophen (TYLENOL) tablet 650 mg, 650  mg, Oral, Q6H PRN, 650 mg at 12/18/22 0831 **OR** acetaminophen (TYLENOL) suppository 650 mg, 650 mg, Rectal, Q6H PRN, Opyd, Timothy S, MD   albuterol (PROVENTIL) (2.5 MG/3ML) 0.083% nebulizer solution 2.5 mg, 2.5 mg, Inhalation, Q4H PRN, Opyd, Ilene Qua, MD   citalopram (CELEXA) tablet 40 mg, 40 mg, Oral, Daily, Opyd, Ilene Qua, MD, 40 mg at 12/20/22 0959   clonazePAM (KLONOPIN) tablet 0.5 mg, 0.5 mg, Oral, TID PRN, Opyd, Ilene Qua, MD, 0.5 mg at 12/19/22 2148   enoxaparin (LOVENOX) injection 40 mg, 40 mg, Subcutaneous, Q24H, Opyd, Ilene Qua, MD, 40 mg at 12/19/22 2030   insulin aspart (novoLOG) injection 0-15 Units, 0-15 Units, Subcutaneous, TID with meals, Dwyane Dee, MD, 3 Units at 12/20/22 0849   insulin aspart (novoLOG) injection 0-5 Units, 0-5 Units, Subcutaneous, QHS, Dwyane Dee, MD, 3 Units at 12/19/22 2159   insulin aspart (novoLOG) injection 4 Units, 4 Units, Subcutaneous, TID WC, Dwyane Dee, MD, 4 Units at 12/20/22 0848   methylPREDNISolone sodium succinate (SOLU-MEDROL) 1,000 mg in sodium chloride 0.9 % 50 mL IVPB, 1,000 mg, Intravenous, Q24H **AND** pantoprazole (PROTONIX) injection 40 mg, 40 mg, Intravenous, Q24H, Greta Doom, MD, 40 mg at 12/19/22 2147   Oral care mouth rinse, 15 mL, Mouth Rinse, PRN, Opyd, Ilene Qua, MD   rosuvastatin (CRESTOR) tablet 10 mg, 10 mg, Oral, Daily, Opyd, Ilene Qua, MD, 10 mg at 12/20/22 0959   senna-docusate (Senokot-S) tablet 1 tablet, 1 tablet, Oral, QHS PRN, Opyd, Ilene Qua, MD   traZODone (DESYREL) tablet 50 mg, 50  mg, Oral, QHS PRN, Dwyane Dee, MD, 50 mg at 12/19/22 2148 Labs CBC    Component Value Date/Time   WBC 14.9 (H) 12/20/2022 0829   RBC 4.57 12/20/2022 0829   HGB 13.4 12/20/2022 0829   HGB 16.3 (H) 03/02/2020 1349   HCT 39.4 12/20/2022 0829   HCT 47.6 (H) 03/02/2020 1349   PLT 277 12/20/2022 0829   PLT 336 03/02/2020 1349   MCV 86.2 12/20/2022 0829   MCV 90 03/02/2020 1349   MCH 29.3 12/20/2022 0829    MCHC 34.0 12/20/2022 0829   RDW 14.0 12/20/2022 0829   RDW 12.6 03/02/2020 1349   LYMPHSABS 1.1 12/20/2022 0829   MONOABS 0.2 12/20/2022 0829   EOSABS 0.0 12/20/2022 0829   BASOSABS 0.0 12/20/2022 0829    CMP     Component Value Date/Time   NA 136 12/20/2022 0829   NA 138 08/02/2019 0851   K 3.5 12/20/2022 0829   CL 102 12/20/2022 0829   CO2 24 12/20/2022 0829   GLUCOSE 176 (H) 12/20/2022 0829   BUN 17 12/20/2022 0829   BUN 10 08/02/2019 0851   CREATININE 0.87 12/20/2022 0829   CALCIUM 8.8 (L) 12/20/2022 0829   PROT 7.0 12/14/2022 0248   PROT 6.6 08/02/2019 0851   ALBUMIN 3.5 12/14/2022 0248   ALBUMIN 4.0 08/02/2019 0851   AST 18 12/14/2022 0248   ALT 27 12/14/2022 0248   ALKPHOS 110 12/14/2022 0248   BILITOT 1.1 12/14/2022 0248   BILITOT 0.7 08/02/2019 0851   GFRNONAA >60 12/20/2022 0829   GFRAA 93 08/02/2019 0851    glycosylated hemoglobin  Lipid Panel     Component Value Date/Time   CHOL 208 (H) 08/02/2019 0851   TRIG 139 08/02/2019 0851   HDL 26 (L) 08/02/2019 0851   CHOLHDL 8.0 (H) 08/02/2019 0851   LDLCALC 156 (H) 08/02/2019 0851     Imaging I have reviewed images in epic and the results pertinent to this consultation are:  CT-scan of the brain no acute findings   MRI brain no acute process   MRI c spine and t spine- no evidence of demyelination   Labs WBC 17.2--> 14.9 Na 134--> 136 Glucose 253-->176  NMO panel pending ANA pending MOG Ab pending  RPR pending  Assessment:  47 y.o. female with PMH significant for HLD, anxiety, depression who presents with painful right eye vision loss. Pain with lateral movements and decreased vision happened abruptly accompanied by headache, also reported continued cold-cough when the symptoms began.   Impression: Optic neuritis  Leukocytosis likely reactive to steroids  Hyperglycemic due to steroids   Recommendations: - continue high dose IV steroids for 5 days- 1032m IV solumedrol daily (day 4/5) -  continue PPI while on steroids - Monitor blood glucose and CBC while on steroids  - Will need outpatient follow up with Dr. SFelecia Shellingafter discharge   Neurology will continue to follow   Pt seen by Neuro NP/APP and later by MD. Note/plan to be edited by MD as needed.    EOtelia Santee DNP, AGACNP-BC [facility] Please use AMION for pager and EPIC for messaging

## 2022-12-20 NOTE — Progress Notes (Signed)
Progress Note    Diane May   [id number]  DOB: 1976/06/04  DOA: 12/17/2022     3 PCP: Medicine, [facility]  Initial CC: Decreased vision right eye, painful right eye movements  Hospital Course: Diane May is a 47 yo female with PMH anxiety and hyperlipidemia who presented to the ER with complaints of blurry vision and pain with right eye movement.  She endorsed symptoms beginning around 12/13/2022 associated with almost complete vision loss that day and painful eye movements.  She had been seen in the ER and was treated with Solu-Medrol and discharged for outpatient follow-up.  She was seen by ophthalmology and recommended for returning to the ER for admission and ongoing high-dose steroids due to concern for optic neuritis. She was evaluated by neurology on admission as well and started on 5-day course of 1 g Solu-Medrol daily.  Interval History:  No events overnight.  Slept better after trazodone last night.  Still sleepy when seen this morning.  Otherwise continues to endorse that her eye pain is almost resolved and she is now also seeing "color" again.  Assessment and Plan: * Optic neuritis - Evaluated outpatient by ophthalmology with concern for optic neuritis and referred to the ER - Neurology also following, present assistance.  Plan is for 5-day course of Solu-Medrol 1 g daily.  Plex would be considered if no improvement -Eye symptoms have improved since admission and starting steroids - Follow-up autoimmune panels - PPI while on steroids  Hyperglycemia - Suspected in setting of high-dose steroid use - A1c 5.5% - Continue SSI and CBG monitoring  Hypokalemia - Replete as needed  Hyperlipidemia - Continue Crestor  Depression with anxiety - Continue Celexa and PRN Klonopin   Old records reviewed in assessment of this patient  Antimicrobials:   DVT prophylaxis:  enoxaparin (LOVENOX) injection 40 mg Start: 12/17/22 2000   Code Status:   Code  Status: Full Code  Mobility Assessment (last 72 hours)     Mobility Assessment     Row Name 12/18/22 1300           Does patient have an order for bedrest or is patient medically unstable No - Continue assessment       What is the highest level of mobility based on the progressive mobility assessment? Level 6 (Walks independently in room and hall) - Balance while walking in room without assist - Complete                Barriers to discharge:  Disposition Plan: Home Saturday Status is: Inpatient  Objective: Blood pressure 113/89, pulse 73, temperature 98 F (36.7 C), temperature source Oral, resp. rate 17, height 5' 4"$  (1.626 m), weight 79.4 kg, last menstrual period 11/21/2022, SpO2 93 %.  Examination:  Physical Exam Constitutional:      Appearance: Normal appearance.  HENT:     Head: Normocephalic and atraumatic.     Mouth/Throat:     Mouth: Mucous membranes are moist.  Eyes:     Extraocular Movements: Extraocular movements intact.     Pupils: Pupils are equal, round, and reactive to light.  Cardiovascular:     Rate and Rhythm: Normal rate and regular rhythm.  Pulmonary:     Effort: Pulmonary effort is normal.     Breath sounds: Normal breath sounds.  Abdominal:     General: Bowel sounds are normal. There is no distension.     Palpations: Abdomen is soft.     Tenderness: There is no  abdominal tenderness.  Musculoskeletal:        General: Normal range of motion.     Cervical back: Normal range of motion and neck supple.  Skin:    General: Skin is warm and dry.  Neurological:     Mental Status: She is alert.     Comments: Improved right eye visual acuity  Psychiatric:        Mood and Affect: Mood normal.      Consultants:  Neurology  Procedures:    Data Reviewed: Results for orders placed or performed during the hospital encounter of 12/17/22 (from the past 24 hour(s))  Glucose, capillary     Status: Abnormal   Collection Time: 12/19/22  4:08 PM   Result Value Ref Range   Glucose-Capillary 257 (H) 70 - 99 mg/dL  Glucose, capillary     Status: Abnormal   Collection Time: 12/19/22  9:52 PM  Result Value Ref Range   Glucose-Capillary 288 (H) 70 - 99 mg/dL  Basic metabolic panel     Status: Abnormal   Collection Time: 12/20/22  8:29 AM  Result Value Ref Range   Sodium 136 135 - 145 mmol/L   Potassium 3.5 3.5 - 5.1 mmol/L   Chloride 102 98 - 111 mmol/L   CO2 24 22 - 32 mmol/L   Glucose, Bld 176 (H) 70 - 99 mg/dL   BUN 17 6 - 20 mg/dL   Creatinine, Ser 0.87 0.44 - 1.00 mg/dL   Calcium 8.8 (L) 8.9 - 10.3 mg/dL   GFR, Estimated >60 >60 mL/min   Anion gap 10 5 - 15  CBC with Differential/Platelet     Status: Abnormal   Collection Time: 12/20/22  8:29 AM  Result Value Ref Range   WBC 14.9 (H) 4.0 - 10.5 K/uL   RBC 4.57 3.87 - 5.11 MIL/uL   Hemoglobin 13.4 12.0 - 15.0 g/dL   HCT 39.4 36.0 - 46.0 %   MCV 86.2 80.0 - 100.0 fL   MCH 29.3 26.0 - 34.0 pg   MCHC 34.0 30.0 - 36.0 g/dL   RDW 14.0 11.5 - 15.5 %   Platelets 277 150 - 400 K/uL   nRBC 0.0 0.0 - 0.2 %   Neutrophils Relative % 89 %   Neutro Abs 13.3 (H) 1.7 - 7.7 K/uL   Lymphocytes Relative 7 %   Lymphs Abs 1.1 0.7 - 4.0 K/uL   Monocytes Relative 2 %   Monocytes Absolute 0.2 0.1 - 1.0 K/uL   Eosinophils Relative 0 %   Eosinophils Absolute 0.0 0.0 - 0.5 K/uL   Basophils Relative 0 %   Basophils Absolute 0.0 0.0 - 0.1 K/uL   Immature Granulocytes 2 %   Abs Immature Granulocytes 0.32 (H) 0.00 - 0.07 K/uL  Magnesium     Status: None   Collection Time: 12/20/22  8:29 AM  Result Value Ref Range   Magnesium 2.4 1.7 - 2.4 mg/dL  Glucose, capillary     Status: Abnormal   Collection Time: 12/20/22  8:29 AM  Result Value Ref Range   Glucose-Capillary 180 (H) 70 - 99 mg/dL  Glucose, capillary     Status: Abnormal   Collection Time: 12/20/22 11:56 AM  Result Value Ref Range   Glucose-Capillary 254 (H) 70 - 99 mg/dL    I have reviewed pertinent nursing notes, vitals, labs,  and images as necessary. I have ordered labwork to follow up on as indicated.  I have reviewed the last notes from staff over  past 24 hours. I have discussed patient's care plan and test results with nursing staff, CM/SW, and other staff as appropriate.  Time spent: Greater than 50% of the 55 minute visit was spent in counseling/coordination of care for the patient as laid out in the A&P.   LOS: 3 days   Dwyane Dee, MD [facility] 12/20/2022, 3:55 PM

## 2022-12-21 DIAGNOSIS — R739 Hyperglycemia, unspecified: Secondary | ICD-10-CM | POA: Diagnosis not present

## 2022-12-21 DIAGNOSIS — H469 Unspecified optic neuritis: Secondary | ICD-10-CM | POA: Diagnosis not present

## 2022-12-21 LAB — CBC WITH DIFFERENTIAL/PLATELET
Abs Immature Granulocytes: 0 10*3/uL (ref 0.00–0.07)
Basophils Absolute: 0 10*3/uL (ref 0.0–0.1)
Basophils Relative: 0 %
Eosinophils Absolute: 0 10*3/uL (ref 0.0–0.5)
Eosinophils Relative: 0 %
HCT: 37.5 % (ref 36.0–46.0)
Hemoglobin: 13.2 g/dL (ref 12.0–15.0)
Lymphocytes Relative: 5 %
Lymphs Abs: 0.8 10*3/uL (ref 0.7–4.0)
MCH: 29.8 pg (ref 26.0–34.0)
MCHC: 35.2 g/dL (ref 30.0–36.0)
MCV: 84.7 fL (ref 80.0–100.0)
Monocytes Absolute: 0.2 10*3/uL (ref 0.1–1.0)
Monocytes Relative: 1 %
Neutro Abs: 14.8 10*3/uL — ABNORMAL HIGH (ref 1.7–7.7)
Neutrophils Relative %: 94 %
Platelets: 263 10*3/uL (ref 150–400)
RBC: 4.43 MIL/uL (ref 3.87–5.11)
RDW: 13.9 % (ref 11.5–15.5)
WBC: 15.7 10*3/uL — ABNORMAL HIGH (ref 4.0–10.5)
nRBC: 0 % (ref 0.0–0.2)
nRBC: 0 /100 WBC

## 2022-12-21 LAB — BASIC METABOLIC PANEL
Anion gap: 9 (ref 5–15)
BUN: 16 mg/dL (ref 6–20)
CO2: 24 mmol/L (ref 22–32)
Calcium: 8.7 mg/dL — ABNORMAL LOW (ref 8.9–10.3)
Chloride: 102 mmol/L (ref 98–111)
Creatinine, Ser: 0.83 mg/dL (ref 0.44–1.00)
GFR, Estimated: >60 mL/min (ref >60)
Glucose, Bld: 175 mg/dL — ABNORMAL HIGH (ref 70–99)
Potassium: 3.5 mmol/L (ref 3.5–5.1)
Sodium: 135 mmol/L (ref 135–145)

## 2022-12-21 LAB — ANGIOTENSIN CONVERTING ENZYME: Angiotensin-Converting Enzyme: 37 U/L (ref 14–82)

## 2022-12-21 LAB — ANA W/REFLEX IF POSITIVE: Anti Nuclear Antibody (ANA): NEGATIVE

## 2022-12-21 LAB — RPR: RPR Ser Ql: NONREACTIVE

## 2022-12-21 LAB — MAGNESIUM: Magnesium: 2.4 mg/dL (ref 1.7–2.4)

## 2022-12-21 MED ORDER — SODIUM CHLORIDE 0.9 % IV SOLN
1000.0000 mg | INTRAVENOUS | Status: AC
  Administered 2022-12-21: 1000 mg via INTRAVENOUS
  Filled 2022-12-21: qty 16

## 2022-12-21 MED ORDER — PANTOPRAZOLE SODIUM 40 MG IV SOLR: 40.0000 mg | INTRAVENOUS | Status: DC

## 2022-12-21 MED ORDER — HYDROCORTISONE 0.5 % EX CREA
TOPICAL_CREAM | Freq: Two times a day (BID) | CUTANEOUS | Status: DC
  Filled 2022-12-21: qty 28.35

## 2022-12-21 NOTE — Plan of Care (Signed)

## 2022-12-21 NOTE — Progress Notes (Signed)
Continues to have improvement in her vision. Sugers have been high, but A1C is 5.5. She is awake, alert, interactive and appropriate.    NMO negative RPR non-reactive ANA negative ACE negative  APA abs - pending MOG pending  47 yo F with optic neuritis without any other symptoms or lesions cnocerning for MS. This is consistent with clinically isolated syndrome. Though there is risk of progression to MS, this is not definite. She will need follow up with outpatient neurology.   Neurology will be available as needed.   Roland Rack, MD [facility] [phone]  If 7pm- 7am, please page neurology on call as listed in [place].

## 2022-12-21 NOTE — Discharge Summary (Signed)
Physician Discharge Summary   Diane May [id number] DOB: 1976/06/26 DOA: 12/17/2022  PCP: [facility], [facility]  Admit date: 12/17/2022 Discharge date: 12/21/2022   Admitted From: Home Disposition:  Home Discharging physician: Dwyane Dee, MD Barriers to discharge:   Recommendations for Outpatient Follow-up:  Follow up with Dr. Felecia Shelling; still labs pending at discharge for workup as well  Discharge Condition: stable CODE STATUS: Full Diet recommendation:  Diet Orders (From admission, onward)     Start     Ordered   12/21/22 0000  Diet general        12/21/22 0930   12/17/22 1938  Diet regular Room service appropriate? Yes; Fluid consistency: Thin  Diet effective now       Question Answer Comment  Room service appropriate? Yes   Fluid consistency: Thin      12/17/22 1938            Hospital Course: Ms. Barnaba is a 47 yo female with PMH anxiety and hyperlipidemia who presented to the ER with complaints of blurry vision and pain with right eye movement.  She endorsed symptoms beginning around 12/13/2022 associated with almost complete vision loss that day and painful eye movements.  She had been seen in the ER and was treated with Solu-Medrol and discharged for outpatient follow-up.  She was seen by ophthalmology and recommended for returning to the ER for admission and ongoing high-dose steroids due to concern for optic neuritis. She was evaluated by neurology on admission as well and started on 5-day course of 1 g Solu-Medrol daily.  She had resolution of her eye symptoms.  Pain with eye movement and blurry vision resolved.  Color vision also returned.  She was recommended for outpatient follow-up with neurology at discharge which she understands.  Assessment and Plan: * Optic neuritis - Evaluated outpatient by ophthalmology with concern for optic neuritis and referred to the ER - Neurology also following, present assistance.  Plan is for 5-day course of  Solu-Medrol 1 g daily.  Course complete did on 12/21/2022 -Eye symptoms have improved since admission and starting steroids; no further symptoms at discharge -Follow-up remaining labs at neurology appointment  Hyperglycemia - Suspected in setting of high-dose steroid use - A1c 5.5%  Hypokalemia - Repleted  Hyperlipidemia - Continue Crestor  Depression with anxiety - Continue Celexa and PRN Klonopin   The patient's chronic medical conditions were treated accordingly per the patient's home medication regimen except as noted.  On day of discharge, patient was felt deemed stable for discharge. Patient/family member advised to call PCP or come back to ER if needed.   Principal Diagnosis: Optic neuritis  Discharge Diagnoses: Active Hospital Problems   Diagnosis Date Noted   Optic neuritis 12/17/2022    Priority: 1.   Hyperglycemia 12/18/2022    Priority: 2.   Hypokalemia 12/17/2022   Hyperlipidemia 12/14/2022   Depression with anxiety 08/15/2012    Resolved Hospital Problems  No resolved problems to display.     Discharge Instructions     Ambulatory referral to Neurology   Complete by: As directed    An appointment is requested in approximately: 2 weeks   Diet general   Complete by: As directed    Increase activity slowly   Complete by: As directed       Allergies as of 12/21/2022       Reactions   Penicillins Rash        Medication List     TAKE these medications  albuterol 108 (90 Base) MCG/ACT inhaler Commonly known as: VENTOLIN HFA Inhale 2 puffs into the lungs every 4 (four) hours as needed for wheezing or shortness of breath.   citalopram 40 MG tablet Commonly known as: CELEXA Take 40 mg by mouth daily.   clonazePAM 0.5 MG tablet Commonly known as: KLONOPIN Take 0.5 mg by mouth 3 (three) times daily as needed for anxiety.   levocetirizine 5 MG tablet Commonly known as: XYZAL Take 5 mg by mouth at bedtime.   phentermine 37.5 MG  tablet Commonly known as: ADIPEX-P Take 37.5 mg by mouth every morning.   rosuvastatin 10 MG tablet Commonly known as: CRESTOR Take 10 mg by mouth daily.        Follow-up Information     Sater, Nanine Means, MD. Schedule an appointment as soon as possible for a visit in 1 month(s).   Specialty: Neurology Contact information: [place] [id number] [phone]                Allergies  Allergen Reactions   Penicillins Rash    Consultations: Neurology  Procedures:   Discharge Exam: BP 131/81 (BP Location: Left Arm)   Pulse 90   Temp 97.7 F (36.5 C) (Oral)   Resp 17   Ht 5' 4"$  (1.626 m)   Wt 79.4 kg   LMP 11/21/2022 (Approximate)   SpO2 94%   BMI 30.04 kg/m  Physical Exam Constitutional:      Appearance: Normal appearance.  HENT:     Head: Normocephalic and atraumatic.     Mouth/Throat:     Mouth: Mucous membranes are moist.  Eyes:     Extraocular Movements: Extraocular movements intact.     Pupils: Pupils are equal, round, and reactive to light.  Cardiovascular:     Rate and Rhythm: Normal rate and regular rhythm.  Pulmonary:     Effort: Pulmonary effort is normal.     Breath sounds: Normal breath sounds.  Abdominal:     General: Bowel sounds are normal. There is no distension.     Palpations: Abdomen is soft.     Tenderness: There is no abdominal tenderness.  Musculoskeletal:        General: Normal range of motion.     Cervical back: Normal range of motion and neck supple.  Skin:    General: Skin is warm and dry.  Neurological:     Mental Status: She is alert.     Comments: Improved right eye visual acuity  Psychiatric:        Mood and Affect: Mood normal.      The results of significant diagnostics from this hospitalization (including imaging, microbiology, ancillary and laboratory) are listed below for reference.   Microbiology: No results found for this or any previous visit (from the past 240 hour(s)).    Labs: BNP (last 3 results) No results for input(s): "BNP" in the last 8760 hours. Basic Metabolic Panel: Recent Labs  Lab 12/17/22 1612 12/18/22 0426 12/19/22 0456 12/20/22 0829 12/21/22 0411  NA 136 132* 134* 136 135  K 3.4* 4.7 3.8 3.5 3.5  CL 101 99 101 102 102  CO2 25 19* 23 24 24  $ GLUCOSE 115* 251* 253* 176* 175*  BUN 16 14 13 17 16  $ CREATININE 0.85 1.02* 0.88 0.87 0.83  CALCIUM 9.2 9.0 8.8* 8.8* 8.7*  MG  --  2.1 2.4 2.4 2.4   Liver Function Tests: No results for input(s): "AST", "ALT", "ALKPHOS", "BILITOT", "PROT", "ALBUMIN"  in the last 168 hours. No results for input(s): "LIPASE", "AMYLASE" in the last 168 hours. No results for input(s): "AMMONIA" in the last 168 hours. CBC: Recent Labs  Lab 12/17/22 1612 12/18/22 0426 12/19/22 0456 12/20/22 0829 12/21/22 0411  WBC 11.4* 9.7 17.2* 14.9* 15.7*  NEUTROABS 6.9  --  15.7* 13.3* 14.8*  HGB 14.7 14.3 13.8 13.4 13.2  HCT 43.8 41.6 39.4 39.4 37.5  MCV 87.1 85.2 85.3 86.2 84.7  PLT 332 301 278 277 263   Cardiac Enzymes: No results for input(s): "CKTOTAL", "CKMB", "CKMBINDEX", "TROPONINI" in the last 168 hours. BNP: Invalid input(s): "POCBNP" CBG: Recent Labs  Lab 12/19/22 2152 12/20/22 0829 12/20/22 1156 12/20/22 1547 12/20/22 2132  GLUCAP 288* 180* 254* 242* 349*   D-Dimer No results for input(s): "DDIMER" in the last 72 hours. Hgb A1c No results for input(s): "HGBA1C" in the last 72 hours. Lipid Profile No results for input(s): "CHOL", "HDL", "LDLCALC", "TRIG", "CHOLHDL", "LDLDIRECT" in the last 72 hours. Thyroid function studies No results for input(s): "TSH", "T4TOTAL", "T3FREE", "THYROIDAB" in the last 72 hours.  Invalid input(s): "FREET3" Anemia work up No results for input(s): "VITAMINB12", "FOLATE", "FERRITIN", "TIBC", "IRON", "RETICCTPCT" in the last 72 hours. Urinalysis    Component Value Date/Time   COLORURINE YELLOW 12/13/2022 1900   APPEARANCEUR CLEAR 12/13/2022 1900   LABSPEC  1.011 12/13/2022 1900   PHURINE 5.0 12/13/2022 1900   GLUCOSEU NEGATIVE 12/13/2022 1900   HGBUR NEGATIVE 12/13/2022 1900   BILIRUBINUR NEGATIVE 12/13/2022 1900   BILIRUBINUR negative 01/25/2022 1729   BILIRUBINUR n 12/09/2019 1618   KETONESUR NEGATIVE 12/13/2022 1900   PROTEINUR NEGATIVE 12/13/2022 1900   UROBILINOGEN 0.2 01/25/2022 1729   NITRITE NEGATIVE 12/13/2022 1900   LEUKOCYTESUR NEGATIVE 12/13/2022 1900   Sepsis Labs Recent Labs  Lab 12/18/22 0426 12/19/22 0456 12/20/22 0829 12/21/22 0411  WBC 9.7 17.2* 14.9* 15.7*   Microbiology No results found for this or any previous visit (from the past 240 hour(s)).  Procedures/Studies: Fluorescein Angiography Optos (Transit OD)  Result Date: 12/17/2022 Right Eye Progression has no prior data. Early phase findings include normal observations. Mid/Late phase findings include leakage, staining (Hyper fluorescence of the disc). Left Eye Progression has no prior data. Early phase findings include normal observations. Mid/Late phase findings include normal observations (No leakage or staining). Notes **Images stored on drive** Impression: OD: Hyper fluorescence of the disc OS: normal study, no leakage or staining   OCT, Retina - OU - Both Eyes  Result Date: 12/17/2022 Right Eye Quality was good. Central Foveal Thickness: 298. Progression has no prior data. Findings include normal foveal contour, no IRF, no SRF, vitreomacular adhesion . Left Eye Quality was good. Central Foveal Thickness: 301. Progression has no prior data. Findings include normal foveal contour, no IRF, no SRF, vitreomacular adhesion . Notes *Images captured and stored on drive Diagnosis / Impression: NFP, no IRF/SRF OU Clinical management: See below Abbreviations: NFP - Normal foveal profile. CME - cystoid macular edema. PED - pigment epithelial detachment. IRF - intraretinal fluid. SRF - subretinal fluid. EZ - ellipsoid zone. ERM - epiretinal membrane. ORA - outer retinal  atrophy. ORT - outer retinal tubulation. SRHM - subretinal hyper-reflective material. IRHM - intraretinal hyper-reflective material   MR THORACIC SPINE W WO CONTRAST  Result Date: 12/14/2022 CLINICAL DATA:  47 year old female with high suspicion of multiple sclerosis. Right eye blurred vision. EXAM: MRI THORACIC WITHOUT AND WITH CONTRAST TECHNIQUE: Multiplanar and multiecho pulse sequences of the thoracic spine were obtained without and  with intravenous contrast. CONTRAST:  7.32m GADAVIST GADOBUTROL 1 MMOL/ML IV SOLN in conjunction with contrast enhanced imaging of the brain and cervical spine reported separately. COMPARISON:  Cervical spine MRI reported separately today. FINDINGS: Limited cervical spine imaging:  Detailed separately. Thoracic spine segmentation:  Appears to be normal. Alignment: Relatively normal thoracic kyphosis. No significant scoliosis or spondylolisthesis. Vertebrae: No marrow edema or evidence of acute osseous abnormality. Visualized bone marrow signal is within normal limits. Cord: Capacious spinal canal. Thoracic spinal cord appears normal to the conus medullaris which is at the L1 level. No convincing signal abnormality or volume loss. No abnormal intradural enhancement or dural thickening. Paraspinal and other soft tissues: Negative. Disc levels: No age advanced thoracic spine degeneration, and capacious underlying spinal canal. Subtle right paracentral disc bulge or protrusion at T3-T4 (series 22, image 12) without stenosis. IMPRESSION: No evidence of thoracic spinal cord demyelination. Normal for age MRI appearance of the Thoracic Spine. Electronically Signed   By: HGenevie AnnM.D.   On: 12/14/2022 06:27   MR CERVICAL SPINE W WO CONTRAST  Result Date: 12/14/2022 CLINICAL DATA:  47year old female with high suspicion of multiple sclerosis. Right eye blurred vision. EXAM: MRI CERVICAL SPINE WITHOUT AND WITH CONTRAST TECHNIQUE: Multiplanar and multiecho pulse sequences of the cervical  spine, to include the craniocervical junction and cervicothoracic junction, were obtained without and with intravenous contrast. CONTRAST:  7.513mGADAVIST GADOBUTROL 1 MMOL/ML IV SOLN in conjunction with contrast enhanced imaging of the brain reported separately. COMPARISON:  Brain MRI the same day reported separately. FINDINGS: Alignment: Mild reversal of cervical lordosis, with subtle degenerative appearing anterolisthesis of C3 on C4 associated with some facet hypertrophy at that level Vertebrae: Chronic degenerative endplate marrow signal changes at C6-C7. Background bone marrow signal within normal limits. No marrow edema or evidence of acute osseous abnormality. Cord: Spinal cord detail mildly degraded by motion artifact throughout this exam, but overall cord signal and morphology appear normal throughout. No abnormal intradural enhancement. No dural thickening. Posterior Fossa, vertebral arteries, paraspinal tissues: Cervicomedullary junction is within normal limits. Posterior fossa detailed separately today. Preserved major vascular flow voids in the neck. The left vertebral artery appears somewhat dominant. Negative visible neck soft tissues. Disc levels: C2-C3:  Mild facet hypertrophy.  No stenosis. C3-C4: Subtle anterolisthesis and disc bulging. Mild to moderate facet hypertrophy greater on the left. Associated mild left C4 foraminal stenosis. C4-C5:  Negative. C5-C6: Mild disc space loss, disc desiccation, disc bulging and endplate spurring. Left C6 neural foramen most affected, with mild foraminal stenosis there. C6-C7: Disc space loss. Circumferential disc osteophyte complex, with dominant component eccentric to the right and directed toward the right neural foramen (series 15, image 27). Mild spinal stenosis. No convincing cord mass effect. Mild left and moderate to severe right C7 foraminal stenosis. C7-T1: Negative disc. Moderate facet hypertrophy greater on the left. Trace degenerative facet joint  fluid on that side. No significant stenosis. Thoracic spine detailed separately. IMPRESSION: 1. No evidence of cervical spinal cord demyelination. 2. Disc and facet degeneration. Subsequent mild spinal stenosis at C6-C7, with moderate to severe right foraminal stenosis. Query Right C7 radiculitis. Electronically Signed   By: H Genevie Ann.D.   On: 12/14/2022 06:24   MR BRAIN W WO CONTRAST  Result Date: 12/14/2022 CLINICAL DATA:  [age]ear old female with high suspicion of multiple sclerosis. Right eye blurred vision. EXAM: MRI HEAD WITHOUT AND WITH CONTRAST TECHNIQUE: Multiplanar, multiecho pulse sequences of the brain and surrounding structures were obtained without and  with intravenous contrast. CONTRAST:  7.2m GADAVIST GADOBUTROL 1 MMOL/ML IV SOLN COMPARISON:  Head CT yesterday. FINDINGS: Brain: Normal cerebral volume. No restricted diffusion to suggest acute infarction. No midline shift, mass effect, evidence of mass lesion, ventriculomegaly, extra-axial collection or acute intracranial hemorrhage. Cervicomedullary junction and pituitary are within normal limits. GPearline Cablesand white matter signal is within normal limits for age throughout the brain. No signal changes suggestive of demyelination. No encephalomalacia identified. Asymmetric anterior right frontal lobe susceptibility on SWI series 8, image 60 appears to be associated with a subtle developmental venous anomaly there (normal variant series 26, image 29. Deep gray matter nuclei, brainstem and cerebellum appear normal. No abnormal enhancement identified. No dural thickening identified. Vascular: Major intracranial vascular flow voids are preserved. The distal left vertebral artery appears dominant. Following contrast major dural venous sinuses are enhancing and appear to be patent. Skull and upper cervical spine: Cervical spine is detailed separately. Visualized bone marrow signal is within normal limits. Sinuses/Orbits: Orbits soft tissues appear grossly  stable and normal on this routine imaging. Bulky bilateral maxillary sinus mucosal thickening and retained secretions. Mild paranasal sinus mucosal thickening elsewhere. Other: Mastoids are clear. Visible internal auditory structures appear normal. Negative visible scalp and face. IMPRESSION: 1. Normal for age MRI appearance of the Brain. No evidence of demyelinating disease. 2. Bilateral maxillary sinusitis. Electronically Signed   By: HGenevie AnnM.D.   On: 12/14/2022 06:19   CT HEAD WO CONTRAST  Result Date: 12/13/2022 CLINICAL DATA:  Blurred vision in the right eye. EXAM: CT HEAD WITHOUT CONTRAST TECHNIQUE: Contiguous axial images were obtained from the base of the skull through the vertex without intravenous contrast. RADIATION DOSE REDUCTION: This exam was performed according to the departmental dose-optimization program which includes automated exposure control, adjustment of the mA and/or kV according to patient size and/or use of iterative reconstruction technique. COMPARISON:  None Available. FINDINGS: Brain: No evidence of acute infarction, hemorrhage, hydrocephalus, extra-axial collection or mass lesion/mass effect. Vascular: No hyperdense vessel or unexpected calcification. Skull: No osseous abnormality. Sinuses/Orbits: Bilateral maxillary sinus mucosal thickening. Visualized mastoid sinuses are clear. Visualized orbits demonstrate no focal abnormality. Other: None IMPRESSION: 1. No acute intracranial findings. 2. Bilateral maxillary sinus disease. Electronically Signed   By: HKathreen DevoidM.D.   On: 12/13/2022 19:43     Time coordinating discharge: Over 30 minutes    DDwyane Dee MD  [facility] 12/21/2022, 3:46 PM

## 2022-12-21 NOTE — Progress Notes (Signed)
Pt will discharge after completion of 1100 dose of IV steroids. Pt has transportation home.

## 2022-12-24 LAB — ANTIPHOSPHOLIPID SYNDROME EVAL, BLD
Anticardiolipin IgA: <9 APL U/mL (ref 0–11)
Anticardiolipin IgG: <9 GPL U/mL (ref 0–14)
Anticardiolipin IgM: <9 MPL U/mL (ref 0–12)
DRVVT: 45.9 s (ref 0.0–47.0)
PTT Lupus Anticoagulant: 26.4 s (ref 0.0–43.5)
Phosphatydalserine, IgA: 1 APS Units (ref 0–19)
Phosphatydalserine, IgG: 9 Units (ref 0–30)
Phosphatydalserine, IgM: 45 Units — ABNORMAL HIGH (ref 0–30)

## 2022-12-28 LAB — MISC LABCORP TEST (SEND OUT)
LabCorp test name: 505310
Labcorp test code: 9985

## 2023-01-02 ENCOUNTER — Ambulatory Visit (INDEPENDENT_AMBULATORY_CARE_PROVIDER_SITE_OTHER): Payer: 59 | Admitting: Ophthalmology

## 2023-01-02 ENCOUNTER — Encounter (INDEPENDENT_AMBULATORY_CARE_PROVIDER_SITE_OTHER): Payer: Self-pay | Admitting: Ophthalmology

## 2023-01-02 DIAGNOSIS — H469 Unspecified optic neuritis: Secondary | ICD-10-CM

## 2023-01-02 DIAGNOSIS — H25813 Combined forms of age-related cataract, bilateral: Secondary | ICD-10-CM | POA: Diagnosis not present

## 2023-01-16 ENCOUNTER — Ambulatory Visit (INDEPENDENT_AMBULATORY_CARE_PROVIDER_SITE_OTHER): Payer: 59 | Admitting: Neurology

## 2023-01-16 ENCOUNTER — Encounter: Payer: Self-pay | Admitting: Neurology

## 2023-01-16 VITALS — BP 140/88 | HR 88 | Ht 64.0 in | Wt 181.0 lb

## 2023-01-16 DIAGNOSIS — R739 Hyperglycemia, unspecified: Secondary | ICD-10-CM | POA: Diagnosis not present

## 2023-01-16 DIAGNOSIS — H469 Unspecified optic neuritis: Secondary | ICD-10-CM

## 2023-01-16 DIAGNOSIS — T380X5A Adverse effect of glucocorticoids and synthetic analogues, initial encounter: Secondary | ICD-10-CM | POA: Diagnosis not present

## 2023-01-16 DIAGNOSIS — G3781 Myelin oligodendrocyte glycoprotein antibody disease: Secondary | ICD-10-CM

## 2023-01-16 NOTE — Patient Instructions (Addendum)
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

## 2023-01-16 NOTE — Progress Notes (Addendum)
[facility]  PATIENT: [place] DOB: October 20, 1976  REFERRING DOCTOR OR PCP: Virgia Land, FNP; Roland Rack MD SOURCE: Patient, notes from recent hospitalization, imaging and lab reports, MRI images personally reviewed.  _________________________________   HISTORICAL  CHIEF COMPLAINT:  Chief Complaint  Patient presents with   Room 11    Pt is here Alone. Pt states that she woke up one day and couldn't see out of her right eye. Pt states that her right eye hurt behind the right eye. Pt states that she doesn't have any blurry vision now.     HISTORY OF PRESENT ILLNESS:  I had the pleasure seeing your patient, Diane [place], at [facility] for neurologic consultation regarding her optic neuritis.  She is a 47 year old woman who experienced right eye pain starting 12/10/2022 and noted some spots in her vision the night of 12/12/2022 associate with intensification of her eye pain.   She woke up 12/13/2022 with near complete visual loss (hand waving only). .  She went to her optometrist (Dr. Marin Comment) and was referred to the emergency room.  She was diagnosed with optic neuritis and given 1 day of IV steroids and discharged.  She returned to her ophthalmologist (Dr. Coralyn Pear) the next day and was advised to go back to the hospital for 5 days of IV steroids.  She received Solu-Medrol 1 g each day.    Symptoms improved with the IV steroids close to baseline by the time of discharge.   Currently, vision is back to normal and colors are not desaturated.      I personally reviewed the MRIs of the brain and spine.  She had no evidence of demyelination.  Optic nerves cannot be adequately assessed on a standard brain MRI.    She had sinusitis  While receiving IV Solumedrol, her sugars were elevated and she was on a sliding scale insulin.   She is not a diabetic.     She denies any episode of other eye symptoms, numbness, weakness or ataxia in the  past.  She has had a chronic cough off/on since November 2023.  This improved in late January.  She has hyperlipidemia and is treated with rosuvastatin.  She is on phentermine for weight.    She has an identical twin who has SLE.     IMAGING MRI of the brain 12/14/2022 was normal.  MRI of the cervical spine 12/14/2022 shows multilevel degenerative changes, mostly mild.  There is mild spinal stenosis at C6-C7 with significant right foraminal narrowing that could affect the right C7 nerve root.  MRI of the thoracic spine 12/14/2022 shows a normal spinal cord and no significant degenerative change.   LABS 12/2022:   Anti-MOG was positive with a titer of 1: 320.    Anti-NMO Ab was negative.   HgbA1c was 5.5;   REVIEW OF SYSTEMS: Constitutional: No fevers, chills, sweats, or change in appetite Eyes: No visual changes, double vision, eye pain Ear, nose and throat: No hearing loss, ear pain, nasal congestion, sore throat Cardiovascular: No chest pain, palpitations Respiratory:  No shortness of breath at rest or with exertion.   No wheezes GastrointestinaI: No nausea, vomiting, diarrhea, abdominal pain, fecal incontinence Genitourinary:  No dysuria, urinary retention or frequency.  No nocturia. Psychiatric: No depression at this time.  Has  anxiety Endocrine: No palpitations, diaphoresis, change in appetite, change in weigh or increased thirst Hematologic/Lymphatic:  No anemia, purpura, petechiae. Allergic/Immunologic: No itchy/runny eyes, nasal congestion, recent allergic  reactions, rashes  ALLERGIES: Allergies  Allergen Reactions   Penicillins Rash    HOME MEDICATIONS:  Current Outpatient Medications:    albuterol (VENTOLIN HFA) 108 (90 Base) MCG/ACT inhaler, Inhale 2 puffs into the lungs every 4 (four) hours as needed for wheezing or shortness of breath., Disp: 18 g, Rfl: 0   citalopram (CELEXA) 40 MG tablet, Take 40 mg by mouth daily., Disp: , Rfl:    clonazePAM (KLONOPIN) 0.5 MG  tablet, Take 0.5 mg by mouth 3 (three) times daily as needed for anxiety., Disp: , Rfl:    levocetirizine (XYZAL) 5 MG tablet, Take 5 mg by mouth at bedtime., Disp: , Rfl:    phentermine (ADIPEX-P) 37.5 MG tablet, Take 37.5 mg by mouth every morning., Disp: , Rfl:    rosuvastatin (CRESTOR) 10 MG tablet, Take 10 mg by mouth daily., Disp: , Rfl:   PAST MEDICAL HISTORY: Past Medical History:  Diagnosis Date   Allergy    Anxiety    Depression    Dyslipidemia (high LDL; low HDL)     PAST SURGICAL HISTORY: Past Surgical History:  Procedure Laterality Date   BREAST SURGERY     cyst removal from L breast   CESAREAN SECTION     x3   CHOLECYSTECTOMY      FAMILY HISTORY: Family History  Problem Relation Age of Onset   Cancer Mother    Cancer Father    Breast cancer Sister        half sister     SOCIAL HISTORY: Social History   Socioeconomic History   Marital status: Married    Spouse name: Not on file   Number of children: Not on file   Years of education: Not on file   Highest education level: Not on file  Occupational History   Not on file  Tobacco Use   Smoking status: Never   Smokeless tobacco: Never  Vaping Use   Vaping Use: Never used  Substance and Sexual Activity   Alcohol use: No    Alcohol/week: 0.0 standard drinks of alcohol   Drug use: No   Sexual activity: Yes    Birth control/protection: Abstinence  Other Topics Concern   Not on file  Social History Narrative   Education: Other. Married. Exercise: 7 days a week   Social Determinants of Health   Financial Resource Strain: Not on file  Food Insecurity: No Food Insecurity (12/19/2022)   Hunger Vital Sign    Worried About Running Out of Food in the Last Year: Never true    Ran Out of Food in the Last Year: Never true  Transportation Needs: No Transportation Needs (12/19/2022)   PRAPARE - Hydrologist (Medical): No    Lack of Transportation (Non-Medical): No  Physical  Activity: Not on file  Stress: Not on file  Social Connections: Not on file  Intimate Partner Violence: Not At Risk (12/19/2022)   Humiliation, Afraid, Rape, and Kick questionnaire    Fear of Current or Ex-Partner: No    Emotionally Abused: No    Physically Abused: No    Sexually Abused: No       PHYSICAL EXAM  Vitals:   01/16/23 0845  BP: (!) 140/88  Pulse: 88  Weight: 181 lb (82.1 kg)  Height: '5\' 4"'$  (1.626 m)    Body mass index is 31.07 kg/m.   General: The patient is well-developed and well-nourished and in no acute distress  HEENT:  Head is [place]/AT.  Sclera are anicteric.  Funduscopic exam shows normal optic discs and retinal vessels.  Neck: No carotid bruits are noted.  The neck is nontender.  Cardiovascular: The heart has a regular rate and rhythm with a normal S1 and S2. There were no murmurs, gallops or rubs.    Skin: Extremities are without rash or  edema.  Musculoskeletal:  Back is nontender  Neurologic Exam  Mental status: The patient is alert and oriented x 3 at the time of the examination. The patient has apparent normal recent and remote memory, with an apparently normal attention span and concentration ability.   Speech is normal.  Cranial nerves: Extraocular movements are full. Pupils are equal, round, and reactive to light and accomodation.  Color vision was symmetric.   Visual fields are full.  Facial symmetry is present. There is good facial sensation to soft touch bilaterally.Facial strength is normal.  Trapezius and sternocleidomastoid strength is normal. No dysarthria is noted.  The tongue is midline, and the patient has symmetric elevation of the soft palate. No obvious hearing deficits are noted.  Motor:  Muscle bulk is normal.   Tone is normal. Strength is  5 / 5 in all 4 extremities.   Sensory: Sensory testing is intact to pinprick, soft touch and vibration sensation in all 4 extremities.  Coordination: Cerebellar testing reveals good  finger-nose-finger and heel-to-shin bilaterally.  Gait and station: Station is normal.   Gait is normal. Tandem gait is normal. Romberg is negative.   Reflexes: Deep tendon reflexes are symmetric and normal bilaterally.   Plantar responses are flexor.     DIAGNOSTIC DATA (LABS, IMAGING, TESTING) - I reviewed patient records, labs, notes, testing and imaging myself where available.  Lab Results  Component Value Date   WBC 15.7 (H) 12/21/2022   HGB 13.2 12/21/2022   HCT 37.5 12/21/2022   MCV 84.7 12/21/2022   PLT 263 12/21/2022      Component Value Date/Time   NA 135 12/21/2022 0411   NA 138 08/02/2019 0851   K 3.5 12/21/2022 0411   CL 102 12/21/2022 0411   CO2 24 12/21/2022 0411   GLUCOSE 175 (H) 12/21/2022 0411   BUN 16 12/21/2022 0411   BUN 10 08/02/2019 0851   CREATININE 0.83 12/21/2022 0411   CALCIUM 8.7 (L) 12/21/2022 0411   PROT 7.0 12/14/2022 0248   PROT 6.6 08/02/2019 0851   ALBUMIN 3.5 12/14/2022 0248   ALBUMIN 4.0 08/02/2019 0851   AST 18 12/14/2022 0248   ALT 27 12/14/2022 0248   ALKPHOS 110 12/14/2022 0248   BILITOT 1.1 12/14/2022 0248   BILITOT 0.7 08/02/2019 0851   GFRNONAA >60 12/21/2022 0411   GFRAA 93 08/02/2019 0851   Lab Results  Component Value Date   CHOL 208 (H) 08/02/2019   HDL 26 (L) 08/02/2019   LDLCALC 156 (H) 08/02/2019   TRIG 139 08/02/2019   CHOLHDL 8.0 (H) 08/02/2019   Lab Results  Component Value Date   HGBA1C 5.5 12/18/2022   No results found for: "VITAMINB12" Lab Results  Component Value Date   TSH 1.260 06/24/2019       ASSESSMENT AND PLAN  MOG antibody disease  Right optic neuritis  Steroid-induced hyperglycemia  In summary, Ms. Emslie is a 47 year old woman who experience optic neuritis in early February 2024.  She was found to have anti-MOG antibodies with a titer 1: 320 (moderate to high titer).  She recovered rapidly with IV steroids and is now at baseline.  Unlikely mass,  MOGAD can be relapsing or  monophasic.  Patients with monophasic disease often develop the demyelinating event after a virus or vaccine and titers improve over time.  She did have a viral infection a few weeks before the event with cough consistent with upper respiratory infection.  I will hold off on treatment at this time as she has not shown a relapsing course and has evidence of bony a single demyelinating event.  However, if she develops another relapse, which would often be optic neuritis or transverse myelitis, then we will begin treatment, probably Actemra.  In a few months if the titer has not improved or resolved, we will still consider treatment as persistent elevated antibodies may predict a higher likelihood of a relapsing course.  Thank you for asking me to see Ms. Guardino.  Please let me know if I can be of further assistance with her or other patients in the future.      Iviona Hole A. Felecia Shelling, MD, [facility] [id number], [id number] AM Certified in Neurology, Clinical Neurophysiology, Sleep Medicine and Neuroimaging  [facility] [address] [place], [place] [address] [phone]

## 2023-01-27 ENCOUNTER — Telehealth: Payer: Self-pay | Admitting: Neurology

## 2023-01-27 NOTE — Telephone Encounter (Signed)
Patient informed of Dr.Sater response "It would be okay to start on meloxicam"

## 2023-01-27 NOTE — Telephone Encounter (Signed)
Reviewed pt chart. Appears the following rx was called in for her:   I called pt . States PCP prescribed for back pain. Back pain started intermittently since this past Christmas. Occurs every other day to daily.  Denies any injuries. Occurs in low back area. Describes as a dull pain.  Saw Dr. Felecia Shelling 01/16/23 as a new pt. Has not started on meloxicam. Wants to know if Dr. 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 Aware I will have him review and call back.

## 2023-01-27 NOTE — Telephone Encounter (Signed)
Pt called. Stated she want to talk to nurse about medication Neloxicam that her PCP put her on.

## 2023-03-04 ENCOUNTER — Telehealth: Payer: Self-pay | Admitting: Neurology

## 2023-03-04 NOTE — Telephone Encounter (Signed)
Called pt. She has been having 1-3 headaches per week. Severity varies. Has had two severe headaches in the last two weeks. Took ibuprofen and tylenol OTC but ineffective.  Yesterday around 2-3pm, she developed another headache. It became severe at bedtime. Started having flashes of black spots in right eye at that point. Did not take any medication. She woke up this am and sx resolved.  Takes meloxicam prn for back pain. Has not taken in last few weeks. Has not started any other new meds recently. Aware I will send to MD for review and will call back after I hear from Dr. Epimenio Foot.

## 2023-03-04 NOTE — Telephone Encounter (Signed)
Pt has called to report that she has been having headaches, it was just yesterday afternoon at 3:00 that with the headache she saw spots.  Pt was asked if she has been to the ED or plans on going, she said no she woke up this morning and is fine.  Pt is asking for a call to discuss.

## 2023-03-05 MED ORDER — TOPIRAMATE 50 MG PO TABS: 50.0000 mg | ORAL_TABLET | Freq: Every evening | ORAL | 5 refills | Status: AC

## 2023-03-05 NOTE — Addendum Note (Signed)
Addended by: Aura Camps on: 03/05/2023 04:16 PM   Modules accepted: Orders

## 2023-03-05 NOTE — Telephone Encounter (Signed)
Pt informed with Dr.Sater reply "Lets send in topiramate 50 mg #30 with 5 refill      1 p.o. nightly     .  Also let her know that she can take a meloxicam when a headache occurs (up to one a day) "   Rx sent for topiramate 50 mg tablets.

## 2023-05-22 ENCOUNTER — Encounter: Payer: Self-pay | Admitting: Neurology

## 2023-05-22 ENCOUNTER — Ambulatory Visit (INDEPENDENT_AMBULATORY_CARE_PROVIDER_SITE_OTHER): Payer: 59 | Admitting: Neurology

## 2023-05-22 ENCOUNTER — Telehealth: Payer: Self-pay | Admitting: Neurology

## 2023-05-22 VITALS — BP 126/87 | HR 84 | Ht 63.0 in | Wt 180.0 lb

## 2023-05-22 DIAGNOSIS — H469 Unspecified optic neuritis: Secondary | ICD-10-CM

## 2023-05-22 DIAGNOSIS — G3781 Myelin oligodendrocyte glycoprotein antibody disease: Secondary | ICD-10-CM | POA: Diagnosis not present

## 2023-05-22 DIAGNOSIS — R4789 Other speech disturbances: Secondary | ICD-10-CM

## 2023-05-22 DIAGNOSIS — R413 Other amnesia: Secondary | ICD-10-CM | POA: Diagnosis not present

## 2023-05-22 DIAGNOSIS — M545 Low back pain, unspecified: Secondary | ICD-10-CM

## 2023-05-22 MED ORDER — ETODOLAC 400 MG PO TABS: 400.0000 mg | ORAL_TABLET | Freq: Two times a day (BID) | ORAL | 5 refills | Status: AC

## 2023-05-22 NOTE — Telephone Encounter (Signed)
sent to [facility] they obtain [employer] auth [id number] 

## 2023-05-22 NOTE — Progress Notes (Signed)
[facility]  PATIENT: Diane [place] DOB: 1975-11-22  REFERRING DOCTOR OR PCP: Denton Meek, FNP; Ritta Slot MD SOURCE: Patient, notes from recent hospitalization, imaging and lab reports, MRI images personally reviewed.  _________________________________   HISTORICAL  CHIEF COMPLAINT:  Chief Complaint  Patient presents with   Room 10    Pt is here Alone. Pt states that she has a lot of back pain in her lower back. Pt states that her memory hasn't been doing good. Pt states that she has a hard time putting together sentence. Pt states that her knees have been hurting her.     HISTORY OF PRESENT ILLNESS:  Elgie Congo with MOGAD and h/o optic neuritis.   UPDATE 05/22/2023 Currently, she reports that the vision is fairly symmetric.  Color vision has returned to normal.    She does note that the right eye is more sensitive to bright light - causing a sharp pain at times.     She has more issues with LBP - aching sensation in the lower back sometimes radiating into the legs.  She has not done PT.    It is worse with activity and better when she is still.    She is noting some memory issues and word finding difficulty.      She is a 47 year old woman who experienced right eye pain starting 12/10/2022 and noted some spots in her vision the night of 12/12/2022 associate with intensification of her eye pain.   She woke up 12/13/2022 with near complete visual loss (hand waving only). .  She went to her optometrist (Dr. Conley Rolls) and was referred to the emergency room.  She was diagnosed with optic neuritis and given 1 day of IV steroids and discharged.  She returned to her ophthalmologist (Dr. Vanessa Barbara) the next day and was advised to go back to the hospital for 5 days of IV steroids.  She received Solu-Medrol 1 g each day.    Symptoms improved with the IV steroids close to baseline by the time of discharge.   Currently, vision is back to normal and colors are not  desaturated.      I personally reviewed the MRIs of the brain and spine.  She had no evidence of demyelination.  Optic nerves cannot be adequately assessed on a standard brain MRI.    She had sinusitis  While receiving IV Solumedrol, her sugars were elevated and she was on a sliding scale insulin.   She is not a diabetic.     She denies any episode of other eye symptoms, numbness, weakness or ataxia in the past.  She has had a chronic cough off/on since November 2023.  This improved in late January.  She has hyperlipidemia and is treated with rosuvastatin.  She is on phentermine for weight.    She has an identical twin who has SLE.     IMAGING MRI of the brain 12/14/2022 was normal.  MRI of the cervical spine 12/14/2022 shows multilevel degenerative changes, mostly mild.  There is mild spinal stenosis at C6-C7 with significant right foraminal narrowing that could affect the right C7 nerve root.  MRI of the thoracic spine 12/14/2022 shows a normal spinal cord and no significant degenerative change.   LABS 12/2022:   Anti-MOG was positive with a titer of 1: 320.    Anti-NMO Ab was negative.   HgbA1c was 5.5;   REVIEW OF SYSTEMS: Constitutional: No fevers, chills, sweats, or change in appetite Eyes: No  visual changes, double vision, eye pain Ear, nose and throat: No hearing loss, ear pain, nasal congestion, sore throat Cardiovascular: No chest pain, palpitations Respiratory:  No shortness of breath at rest or with exertion.   No wheezes GastrointestinaI: No nausea, vomiting, diarrhea, abdominal pain, fecal incontinence Genitourinary:  No dysuria, urinary retention or frequency.  No nocturia. Psychiatric: No depression at this time.  Has  anxiety Endocrine: No palpitations, diaphoresis, change in appetite, change in weigh or increased thirst Hematologic/Lymphatic:  No anemia, purpura, petechiae. Allergic/Immunologic: No itchy/runny eyes, nasal congestion, recent allergic reactions,  rashes  ALLERGIES: Allergies  Allergen Reactions   Penicillins Rash    HOME MEDICATIONS:  Current Outpatient Medications:    albuterol (VENTOLIN HFA) 108 (90 Base) MCG/ACT inhaler, Inhale 2 puffs into the lungs every 4 (four) hours as needed for wheezing or shortness of breath., Disp: 18 g, Rfl: 0   citalopram (CELEXA) 40 MG tablet, Take 40 mg by mouth daily., Disp: , Rfl:    clonazePAM (KLONOPIN) 0.5 MG tablet, Take 0.5 mg by mouth 3 (three) times daily as needed for anxiety., Disp: , Rfl:    etodolac (LODINE) 400 MG tablet, Take 1 tablet (400 mg total) by mouth 2 (two) times daily., Disp: 60 tablet, Rfl: 5   phentermine 37.5 MG capsule, Take 37.5 mg by mouth every morning., Disp: , Rfl:    rosuvastatin (CRESTOR) 10 MG tablet, Take 10 mg by mouth daily., Disp: , Rfl:    topiramate (TOPAMAX) 50 MG tablet, Take 1 tablet (50 mg total) by mouth at bedtime., Disp: 30 tablet, Rfl: 5  PAST MEDICAL HISTORY: Past Medical History:  Diagnosis Date   Allergy    Anxiety    Depression    Dyslipidemia (high LDL; low HDL)     PAST SURGICAL HISTORY: Past Surgical History:  Procedure Laterality Date   BREAST SURGERY     cyst removal from L breast   CESAREAN SECTION     x3   CHOLECYSTECTOMY      FAMILY HISTORY: Family History  Problem Relation Age of Onset   Cancer Mother    Cancer Father    Breast cancer Sister        half sister     SOCIAL HISTORY: Social History   Socioeconomic History   Marital status: Married    Spouse name: Not on file   Number of children: Not on file   Years of education: Not on file   Highest education level: Not on file  Occupational History   Not on file  Tobacco Use   Smoking status: Never   Smokeless tobacco: Never  Vaping Use   Vaping status: Never Used  Substance and Sexual Activity   Alcohol use: No    Alcohol/week: 0.0 standard drinks of alcohol   Drug use: No   Sexual activity: Yes    Birth control/protection: Abstinence  Other  Topics Concern   Not on file  Social History Narrative   Education: Other. Married. Exercise: 7 days a week   Social Determinants of Health   Financial Resource Strain: Not on file  Food Insecurity: No Food Insecurity (12/19/2022)   Hunger Vital Sign    Worried About Running Out of Food in the Last Year: Never true    Ran Out of Food in the Last Year: Never true  Transportation Needs: No Transportation Needs (12/19/2022)   PRAPARE - Administrator, Civil Service (Medical): No    Lack of Transportation (Non-Medical): No  Physical Activity: Not on file  Stress: Not on file  Social Connections: Not on file  Intimate Partner Violence: Not At Risk (12/19/2022)   Humiliation, Afraid, Rape, and Kick questionnaire    Fear of Current or Ex-Partner: No    Emotionally Abused: No    Physically Abused: No    Sexually Abused: No       PHYSICAL EXAM  Vitals:   05/22/23 1020  BP: 126/87  Pulse: 84  Weight: 180 lb (81.6 kg)  Height: 5\' 3"  (1.6 m)    Body mass index is 31.89 kg/m.  VA: OS 20/30 OD 20/20 (back to normal)  General: The patient is well-developed and well-nourished and in no acute distress  HEENT:  Head is [place]/AT.  Sclera are anicteric.   Neck: No carotid bruits are noted.  The neck is nontender.  Cardiovascular: The heart has a regular rate and rhythm with a normal S1 and S2. There were no murmurs, gallops or rubs.    Skin: Extremities are without rash or  edema.  Musculoskeletal:  Back is nontender  Neurologic Exam  Mental status: The patient is alert and oriented x 3 at the time of the examination. The patient has apparent normal recent and remote memory, with an apparently normal attention span and concentration ability.   Speech is normal.  Cranial nerves: Extraocular movements are full. Pupils are equal, round, and reactive to light and accomodation.  Color vision was symmetric.   No APD  Facial symmetry is present. There is good facial sensation  to soft touch bilaterally.Facial strength is normal.  Trapezius and sternocleidomastoid strength is normal. No dysarthria is noted.   No obvious hearing deficits are noted.  Motor:  Muscle bulk is normal.   Tone is normal. Strength is  5 / 5 in all 4 extremities.   Sensory: Sensory testing is intact to pinprick, soft touch and vibration sensation in all 4 extremities.  Coordination: Cerebellar testing reveals good finger-nose-finger and heel-to-shin bilaterally.  Gait and station: Station is normal.   Gait is normal. Tandem gait is normal. Romberg is negative.   Reflexes: Deep tendon reflexes are symmetric and normal bilaterally.         DIAGNOSTIC DATA (LABS, IMAGING, TESTING) - I reviewed patient records, labs, notes, testing and imaging myself where available.  Lab Results  Component Value Date   WBC 15.7 (H) 12/21/2022   HGB 13.2 12/21/2022   HCT 37.5 12/21/2022   MCV 84.7 12/21/2022   PLT 263 12/21/2022      Component Value Date/Time   NA 135 12/21/2022 0411   NA 138 08/02/2019 0851   K 3.5 12/21/2022 0411   CL 102 12/21/2022 0411   CO2 24 12/21/2022 0411   GLUCOSE 175 (H) 12/21/2022 0411   BUN 16 12/21/2022 0411   BUN 10 08/02/2019 0851   CREATININE 0.83 12/21/2022 0411   CALCIUM 8.7 (L) 12/21/2022 0411   PROT 7.0 12/14/2022 0248   PROT 6.6 08/02/2019 0851   ALBUMIN 3.5 12/14/2022 0248   ALBUMIN 4.0 08/02/2019 0851   AST 18 12/14/2022 0248   ALT 27 12/14/2022 0248   ALKPHOS 110 12/14/2022 0248   BILITOT 1.1 12/14/2022 0248   BILITOT 0.7 08/02/2019 0851   GFRNONAA >60 12/21/2022 0411   GFRAA 93 08/02/2019 0851   Lab Results  Component Value Date   CHOL 208 (H) 08/02/2019   HDL 26 (L) 08/02/2019   LDLCALC 156 (H) 08/02/2019   TRIG 139 08/02/2019   CHOLHDL  8.0 (H) 08/02/2019   Lab Results  Component Value Date   HGBA1C 5.5 12/18/2022   No results found for: "VITAMINB12" Lab Results  Component Value Date   TSH 1.260 06/24/2019       ASSESSMENT  AND PLAN  MOG antibody disease - Plan: Anti-MOG, Serum, MR BRAIN W WO CONTRAST  Right optic neuritis - Plan: Anti-MOG, Serum  Memory loss - Plan: MR BRAIN W WO CONTRAST  Word finding difficulty - Plan: MR BRAIN W WO CONTRAST  Low back pain, unspecified back pain laterality, unspecified chronicity, unspecified whether sciatica present   Due to MOGAD and new neurologic symptoms, will check MRI brain Etodolac and back exercises for lumbar pain.  If no better in 6+ weeks, consider MRI to further characterize.   Rtc 6 months or sooner if new issues   Makayla Lanter A. Epimenio Foot, MD, [facility] 05/22/2023, 11:18 AM Certified in Neurology, Clinical Neurophysiology, Sleep Medicine and Neuroimaging  [facility] [address] [place], [place] [address] [phone]

## 2023-05-27 LAB — ANTI-MOG ANTIBODY TITER, SERUM: Anti-MOG Antibody Titer, Serum: 1:32 {titer}

## 2023-05-27 LAB — ANTI-MOG, SERUM: MOG Antibody, Cell-based IFA: POSITIVE — AB

## 2023-06-06 ENCOUNTER — Encounter: Payer: Self-pay | Admitting: Neurology

## 2023-06-12 ENCOUNTER — Other Ambulatory Visit: Payer: 59

## 2023-06-26 ENCOUNTER — Inpatient Hospital Stay: Admission: RE | Admit: 2023-06-26 | Payer: 59 | Source: Ambulatory Visit

## 2023-06-26 DIAGNOSIS — R413 Other amnesia: Secondary | ICD-10-CM | POA: Diagnosis not present

## 2023-06-26 DIAGNOSIS — G3781 Myelin oligodendrocyte glycoprotein antibody disease: Secondary | ICD-10-CM

## 2023-06-26 DIAGNOSIS — R4789 Other speech disturbances: Secondary | ICD-10-CM

## 2023-06-26 MED ORDER — GADOPICLENOL 0.5 MMOL/ML IV SOLN
8.0000 mL | Freq: Once | INTRAVENOUS | Status: AC | PRN
  Administered 2023-06-26: 8 mL via INTRAVENOUS

## 2023-06-30 ENCOUNTER — Telehealth: Payer: Self-pay | Admitting: Neurology

## 2023-06-30 NOTE — Telephone Encounter (Signed)
Pt states her PCP is unable to fill this for her right now, pt states since she just saw Dr Epimenio Foot she'd like to know if he would fill this for her once: phentermine 37.5 MG capsule  to [facility] [phone]

## 2023-07-01 ENCOUNTER — Other Ambulatory Visit: Payer: Self-pay | Admitting: Neurology

## 2023-07-01 ENCOUNTER — Encounter: Payer: Self-pay | Admitting: Neurology

## 2023-07-01 MED ORDER — PHENTERMINE HCL 37.5 MG PO CAPS: 37.5000 mg | ORAL_CAPSULE | ORAL | 1 refills | Status: AC

## 2023-07-03 ENCOUNTER — Encounter (INDEPENDENT_AMBULATORY_CARE_PROVIDER_SITE_OTHER): Payer: 59 | Admitting: Ophthalmology

## 2023-07-03 DIAGNOSIS — H469 Unspecified optic neuritis: Secondary | ICD-10-CM

## 2023-07-03 DIAGNOSIS — H25813 Combined forms of age-related cataract, bilateral: Secondary | ICD-10-CM

## 2023-12-04 ENCOUNTER — Ambulatory Visit: Payer: 59 | Admitting: Neurology

## 2024-04-11 ENCOUNTER — Ambulatory Visit
Admission: RE | Admit: 2024-04-11 | Discharge: 2024-04-11 | Disposition: A | Discharge status: Home / Self Care | Source: Ambulatory Visit | Attending: Nurse Practitioner | Admitting: Nurse Practitioner

## 2024-04-11 VITALS — BP 143/88 | HR 103 | Temp 98.0°F | Resp 16

## 2024-04-11 DIAGNOSIS — R21 Rash and other nonspecific skin eruption: Secondary | ICD-10-CM | POA: Diagnosis not present

## 2024-04-11 MED ORDER — PREDNISONE 20 MG PO TABS: 40.0000 mg | ORAL_TABLET | Freq: Every day | ORAL | 0 refills | Status: AC

## 2024-04-11 MED ORDER — DEXAMETHASONE SODIUM PHOSPHATE 10 MG/ML IJ SOLN
10.0000 mg | INTRAMUSCULAR | Status: AC
  Administered 2024-04-11: 10 mg via INTRAMUSCULAR

## 2024-04-11 MED ORDER — TRIAMCINOLONE ACETONIDE 0.1 % EX CREA: 1.0000 | TOPICAL_CREAM | Freq: Two times a day (BID) | CUTANEOUS | 0 refills | Status: AC

## 2024-04-11 NOTE — ED Triage Notes (Signed)
 Poison oak on arms, face and stomach x 4 days

## 2024-04-11 NOTE — ED Provider Notes (Signed)
 [facility]    CSN: [id number] Arrival date & time: 04/11/24  1048      History   Chief Complaint Chief Complaint  Patient presents with   Poison Ivy    Entered by patient    HPI Diane May is a 48 y.o. female.   The history is provided by the patient.   Patient presents with a 4-day history of rash to her bilateral arms, face, left ear, and under the bilateral breast.  Patient states that the rash is itchy.  She states the symptoms started shortly after she picked up a pickle ball.  She states that she did not see any obvious poison ivy or poison oak.  She denies further exposure to new medications, lotions, detergents, or foods.  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  Patient has been using over-the-counter calamine lotion for her symptoms.  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 Authorizing Provider  albuterol  (VENTOLIN  HFA) 108 (90 Base) MCG/ACT inhaler Inhale 2 puffs into the lungs every 4 (four) hours as needed for wheezing or shortness of breath. 01/04/21   Avegno, Komlanvi S, FNP  clonazePAM  (KLONOPIN )  0.5 MG tablet Take 0.5 mg by mouth 3 (three) times daily as needed for anxiety. 06/30/22   [provider]  etodolac  (LODINE ) 400 MG tablet Take 1 tablet (400 mg total) by mouth 2 (two) times daily. 05/22/23   Sater, Sherida Dimmer, MD  phentermine  37.5 MG capsule Take 1 capsule (37.5 mg total) by mouth every morning. 07/01/23   Sater, Sherida Dimmer, MD  rosuvastatin  (CRESTOR ) 10 MG tablet Take 10 mg by mouth daily.    [provider]  topiramate  (TOPAMAX ) 50 MG tablet Take 1 tablet (50 mg total) by mouth at bedtime. 03/05/23   Sater, Sherida Dimmer, MD    Family History Family History  Problem Relation Age of Onset   Cancer Mother    Cancer Father    Breast cancer Sister        half sister     Social History Social History   Tobacco Use   Smoking status: Never   Smokeless tobacco: Never  Vaping Use   Vaping status: Never Used  Substance Use Topics   Alcohol use: No  Alcohol/week: 0.0 standard drinks of alcohol   Drug use: No     Allergies   Penicillins   Review of Systems Review of Systems Per HPI  Physical Exam Triage Vital Signs ED Triage Vitals  Encounter Vitals Group     BP 04/11/24 1056 (!) 143/88     Systolic BP Percentile --      Diastolic BP Percentile --      Pulse Rate 04/11/24 1056 (!) 103     Resp 04/11/24 1056 16     Temp 04/11/24 1056 98 F (36.7 C)     Temp Source 04/11/24 1056 Oral     SpO2 04/11/24 1056 93 %     Weight --      Height --      Head Circumference --      Peak Flow --      Pain Score 04/11/24 1057 6     Pain Loc --      Pain Education --      Exclude from Growth Chart --    No data found.  Updated Vital Signs BP (!) 143/88 (BP Location: Right Arm)   Pulse (!) 103   Temp 98 F (36.7 C) (Oral)   Resp 16   LMP 04/05/2024 (Approximate)   SpO2 93%   Visual Acuity Right Eye Distance:   Left Eye Distance:   Bilateral Distance:    Right Eye Near:   Left Eye Near:    Bilateral Near:     Physical Exam Vitals and  nursing note reviewed.  Constitutional:      General: She is not in acute distress.    Appearance: Normal appearance.  HENT:     Head: Normocephalic.  Eyes:     Pupils: Pupils are equal, round, and reactive to light.  Pulmonary:     Effort: Pulmonary effort is normal.  Musculoskeletal:     Cervical back: Normal range of motion.  Skin:    General: Skin is warm and dry.     Findings: Rash present. Rash is macular and papular.     Comments: Maculopapular rash noted to the bilateral arms, left face, left ear, and under the breast.  There is no oozing, fluctuance, or drainage present.  Neurological:     General: No focal deficit present.     Mental Status: She is alert and oriented to person, place, and time.  Psychiatric:        Mood and Affect: Mood normal.        Behavior: Behavior normal.      UC Treatments / Results  Labs (all labs ordered are listed, but only abnormal results are displayed) Labs Reviewed - No data to display  EKG   Radiology No results found.  Procedures Procedures (including critical care time)  Medications Ordered in UC Medications  dexamethasone (DECADRON) injection 10 mg (has no administration in time range)    Initial Impression / Assessment and Plan / UC Course  I have reviewed the triage vital signs and the nursing notes.  Pertinent labs & imaging results that were available during my care of the patient were reviewed by me and considered in my medical decision making (see chart for details).  Will treat for rash and nonspecific skin eruption.  Patient given injection of Decadron 10 mg.  Start prednisone  40 mg on 04/12/24, and triamcinolone  cream 0.1% to apply topically to the affected areas.  Supportive care recommendations were provided and discussed with the patient to include  over-the-counter antihistamines, cool cloth to the affected area, and use of Aveeno colloidal oatmeal bath.  Discussed indications with patient regarding follow-up.   Patient was in agreement with this plan of care and verbalizes understanding.  All questions were answered.  Patient stable for discharge.   Final Clinical Impressions(s) / UC Diagnoses   Final diagnoses:  Rash and nonspecific skin eruption   Discharge Instructions   None    ED Prescriptions   None    PDMP not reviewed this encounter.   Hardy Lia, NP 04/11/24 1115

## 2024-04-11 NOTE — Discharge Instructions (Signed)
 You were given an injection of Decadron 10 mg.  Start the prednisone  on 04/12/2024. Take medication as prescribed. May also take over-the-counter Zyrtec during the daytime and Benadryl at bedtime to help with itching. Avoid hot baths or showers while symptoms persist.  Recommend taking lukewarm baths. May apply cool cloths to the area to help with itching or discomfort. Avoid scratching, rubbing, or manipulating the areas while symptoms persist. Recommend Aveeno Colloidal Oatmeal bath to use to help with drying and itching. Follow up if symptoms do not improve.

## 2024-04-22 ENCOUNTER — Other Ambulatory Visit (HOSPITAL_COMMUNITY): Payer: Self-pay

## 2024-04-22 DIAGNOSIS — Z1231 Encounter for screening mammogram for malignant neoplasm of breast: Secondary | ICD-10-CM

## 2024-05-20 ENCOUNTER — Inpatient Hospital Stay (HOSPITAL_COMMUNITY): Admission: RE | Admit: 2024-05-20 | Source: Ambulatory Visit

## 2024-05-21 ENCOUNTER — Encounter: Payer: Self-pay | Admitting: Advanced Practice Midwife

## 2024-06-17 ENCOUNTER — Ambulatory Visit: Payer: 59 | Admitting: Neurology

## 2025-01-13 ENCOUNTER — Ambulatory Visit: Admitting: Neurology
